# Patient Record
Sex: Female | Born: 1985 | Race: Black or African American | Hispanic: No | Marital: Single | Smoking: Former smoker
Health system: Southern US, Community
[De-identification: ages and names within clinical notes are randomized; demographics above are authoritative.]

## PROBLEM LIST (undated history)

## (undated) DIAGNOSIS — G43909 Migraine, unspecified, not intractable, without status migrainosus: Secondary | ICD-10-CM

## (undated) DIAGNOSIS — D649 Anemia, unspecified: Secondary | ICD-10-CM

## (undated) DIAGNOSIS — I10 Essential (primary) hypertension: Secondary | ICD-10-CM

## (undated) DIAGNOSIS — K219 Gastro-esophageal reflux disease without esophagitis: Secondary | ICD-10-CM

## (undated) DIAGNOSIS — Z8719 Personal history of other diseases of the digestive system: Secondary | ICD-10-CM

## (undated) HISTORY — DX: Gastro-esophageal reflux disease without esophagitis: K21.9

## (undated) HISTORY — DX: Personal history of other diseases of the digestive system: Z87.19

## (undated) HISTORY — DX: Essential (primary) hypertension: I10

## (undated) HISTORY — DX: Anemia, unspecified: D64.9

---

## 2003-06-15 ENCOUNTER — Ambulatory Visit (HOSPITAL_COMMUNITY): Admission: RE | Admit: 2003-06-15 | Discharge: 2003-06-15 | Payer: Self-pay | Admitting: Internal Medicine

## 2003-06-21 ENCOUNTER — Emergency Department (HOSPITAL_COMMUNITY): Admission: EM | Admit: 2003-06-21 | Discharge: 2003-06-21 | Payer: Self-pay | Admitting: Emergency Medicine

## 2004-09-01 HISTORY — PX: WISDOM TOOTH EXTRACTION: SHX21

## 2005-04-11 ENCOUNTER — Ambulatory Visit: Payer: Self-pay | Admitting: Family Medicine

## 2010-06-28 ENCOUNTER — Ambulatory Visit: Payer: Self-pay | Admitting: Internal Medicine

## 2013-12-30 ENCOUNTER — Ambulatory Visit: Payer: Self-pay | Admitting: Family Medicine

## 2014-02-15 ENCOUNTER — Ambulatory Visit: Payer: Self-pay | Admitting: Family Medicine

## 2014-03-01 ENCOUNTER — Ambulatory Visit: Payer: Self-pay | Admitting: Family Medicine

## 2014-04-24 ENCOUNTER — Encounter (INDEPENDENT_AMBULATORY_CARE_PROVIDER_SITE_OTHER): Payer: Self-pay

## 2014-04-24 ENCOUNTER — Ambulatory Visit (INDEPENDENT_AMBULATORY_CARE_PROVIDER_SITE_OTHER): Payer: BC Managed Care – PPO | Admitting: Family Medicine

## 2014-04-24 ENCOUNTER — Encounter: Payer: Self-pay | Admitting: Family Medicine

## 2014-04-24 VITALS — BP 118/76 | HR 75 | Temp 98.4°F | Ht 64.25 in | Wt 171.2 lb

## 2014-04-24 DIAGNOSIS — E559 Vitamin D deficiency, unspecified: Secondary | ICD-10-CM

## 2014-04-24 DIAGNOSIS — Z803 Family history of malignant neoplasm of breast: Secondary | ICD-10-CM | POA: Insufficient documentation

## 2014-04-24 DIAGNOSIS — K219 Gastro-esophageal reflux disease without esophagitis: Secondary | ICD-10-CM | POA: Insufficient documentation

## 2014-04-24 DIAGNOSIS — N644 Mastodynia: Secondary | ICD-10-CM | POA: Insufficient documentation

## 2014-04-24 MED ORDER — OMEPRAZOLE 20 MG PO CPDR: 20.0000 mg | DELAYED_RELEASE_CAPSULE | Freq: Every day | ORAL | Status: DC

## 2014-04-24 NOTE — Progress Notes (Signed)
Subjective:    Patient ID: Ashlee Perry, female    DOB: 1986-06-19, 28 y.o.   MRN: [id number]  HPI Here to est from primary care   Used to see Dr Derrill [facility]   Had an episode of chest wall pain last month - and then soreness to the touch (worse if jumping or running) That is improved  It made her nervous  Family hx of breast cancer - mother - in her 73s  Does not feel any knots - self exams are ok   Does get tender breasts around menses  Not on birth control  Not sexually active  Periods are generally regular -occ early if she is stress  Last visit at health dept - checked for stds and yeast  Everything was negative   Last blood work was done at Dr Dorris Carnes. 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 Breast tenderness 04/24/2014  . Unspecified vitamin D deficiency 04/24/2014  . GERD (gastroesophageal reflux disease) 04/24/2014  . Family history of breast cancer in first degree relative 04/24/2014   Past Medical History  Diagnosis Date  . History of gastroesophageal reflux (GERD)    Past Surgical History  Procedure Laterality Date  . Wisdom tooth extraction  2006   History  Substance Use Topics  . Smoking status: Never Smoker   . Smokeless tobacco: Not on file  . Alcohol Use: Yes   Comment: wine-occ   Family History  Problem Relation Age of Onset  . Arthritis Maternal Grandfather   . Kidney failure Maternal Grandfather   . Ovarian cancer Paternal Grandmother   . Breast cancer Mother   . Prostate cancer Paternal Grandfather   . Hypertension Mother   . Hypertension Father   . Diabetes Maternal Grandmother    No Known Allergies No current outpatient prescriptions on file prior to visit.   No current facility-administered medications on file prior to visit.    Review of Systems Review of Systems  Constitutional: Negative for fever, appetite change, fatigue and unexpected weight change.  Eyes: Negative for pain and visual disturbance.  Respiratory: Negative for cough and shortness of breath.  pos for chest wall tenderness several weeks ago that is resolved now  Cardiovascular: Negative for cp or palpitations    Gastrointestinal: Negative for nausea, diarrhea and constipation. pos for heartburn, neg for dark stool or blood in stool Genitourinary: Negative for urgency and frequency. pos for intermittent breast tenderness  Skin: Negative for pallor or rash   Neurological: Negative for weakness, light-headedness, numbness and headaches.  Hematological: Negative for adenopathy. Does not bruise/bleed easily.  Psychiatric/Behavioral: Negative for dysphoric mood. The patient is not nervous/anxious.  Objective:   Physical Exam  Constitutional: She appears well-developed and well-nourished. No distress.  obese and well appearing   HENT:  Head: Normocephalic and atraumatic.  Right Ear: External ear normal.  Left Ear: External ear normal.  Nose: Nose normal.  Mouth/Throat: Oropharynx is clear and moist.  Scant cerumen  Eyes: Conjunctivae and EOM are normal. Pupils are equal, round, and reactive to light. No scleral icterus.  Neck: Normal range of motion. Neck supple. No JVD present. No thyromegaly present.  Cardiovascular: Normal rate, regular rhythm, normal  heart sounds and intact distal pulses.  Exam reveals no gallop.   Pulmonary/Chest: Effort normal and breath sounds normal. No respiratory distress. She has no wheezes. She has no rales. She exhibits no tenderness.  Abdominal: Soft. Bowel sounds are normal. She exhibits no distension and no mass. There is no tenderness.  Genitourinary:  Breast exam: No mass, nodules, thickening, tenderness, bulging, retraction, inflamation, nipple discharge or skin changes noted.  No axillary or clavicular LA.        Musculoskeletal: She exhibits no edema and no tenderness.  Lymphadenopathy:    She has no cervical adenopathy.  Neurological: She is alert. She has normal reflexes. No cranial nerve deficit. She exhibits normal muscle tone. Coordination normal.  Skin: Skin is warm and dry. No rash noted. No erythema. No pallor.  Psychiatric: She has a normal mood and affect.          Assessment & Plan:   Problem List Items Addressed This Visit     Digestive   GERD (gastroesophageal reflux disease)     Symptom frequency warrants daily PPI tx  Px omeprazole 20 mg for daily use  Will re check at health mt exam Diet info given for gerd as well  Wt loss enc    Relevant Medications      omeprazole (PRILOSEC) capsule     Other   Breast tenderness - Primary     Suspect fibrocystic change  Reassuring exam today In light of fam hx of breast ca will ref for bilat dx mammogram Disc limiting caffeine as well  Enc self breast exams     Relevant Orders      MM Digital Diagnostic Bilat   Unspecified vitamin D deficiency     Sent for labs from winter from prev physician  Is on 2 mvi daily and getting sun exposure also now  No symptoms Disc imp of D for bone and overall health     Family history of breast cancer in first degree relative     Disc risk factor for breast ca Enc self exams  Dx mammo scheduled for breast tenderness    Relevant Orders      MM Digital Diagnostic Bilat

## 2014-04-24 NOTE — Progress Notes (Signed)
Pre visit review using our clinic review tool, if applicable. No additional management support is needed unless otherwise documented below in the visit note. 

## 2014-04-24 NOTE — Assessment & Plan Note (Signed)
Suspect fibrocystic change  Reassuring exam today In light of fam hx of breast ca will ref for bilat dx mammogram Disc limiting caffeine as well  Enc self breast exams

## 2014-04-24 NOTE — Patient Instructions (Addendum)
Stop at check out for mammogram referral  Take omeprazole every day - I sent a px to your pharmacy  Avoid caffeine  Take multi vitamins as directed  Send for last labs from Dr Juluis Pitch please   Follow up in Jan or Feb for annual exam with labs prior

## 2014-04-24 NOTE — Assessment & Plan Note (Signed)
Sent for labs from winter from prev physician  Is on 2 mvi daily and getting sun exposure also now  No symptoms Disc imp of D for bone and overall health

## 2014-04-24 NOTE — Assessment & Plan Note (Signed)
Symptom frequency warrants daily PPI tx  Px omeprazole 20 mg for daily use  Will re check at health mt exam Diet info given for gerd as well  Wt loss enc

## 2014-04-24 NOTE — Assessment & Plan Note (Addendum)
Disc risk factor for breast ca Enc self exams  Dx mammo scheduled for breast tenderness

## 2014-05-05 ENCOUNTER — Other Ambulatory Visit: Payer: Self-pay | Admitting: Family Medicine

## 2014-05-05 DIAGNOSIS — N644 Mastodynia: Secondary | ICD-10-CM

## 2014-05-05 DIAGNOSIS — Z803 Family history of malignant neoplasm of breast: Secondary | ICD-10-CM

## 2014-05-15 ENCOUNTER — Ambulatory Visit
Admission: RE | Admit: 2014-05-15 | Discharge: 2014-05-15 | Disposition: A | Discharge status: Home / Self Care | Payer: BC Managed Care – PPO | Source: Ambulatory Visit | Attending: Family Medicine | Admitting: Family Medicine

## 2014-05-15 DIAGNOSIS — Z803 Family history of malignant neoplasm of breast: Secondary | ICD-10-CM

## 2014-05-15 DIAGNOSIS — N644 Mastodynia: Secondary | ICD-10-CM

## 2014-05-16 ENCOUNTER — Encounter: Payer: Self-pay | Admitting: *Deleted

## 2014-10-01 ENCOUNTER — Telehealth: Payer: Self-pay | Admitting: Family Medicine

## 2014-10-01 DIAGNOSIS — E559 Vitamin D deficiency, unspecified: Secondary | ICD-10-CM

## 2014-10-01 DIAGNOSIS — Z0000 Encounter for general adult medical examination without abnormal findings: Secondary | ICD-10-CM | POA: Insufficient documentation

## 2014-10-01 NOTE — Telephone Encounter (Signed)
-----   Message from Alvina Chouerri J Walsh sent at 09/27/2014  3:08 PM EST ----- Regarding: Lab orders for Monday, 2.1.16 Patient is scheduled for CPX labs, please order future labs, Thanks , Camelia Engerri

## 2014-10-02 ENCOUNTER — Other Ambulatory Visit: Payer: BC Managed Care – PPO

## 2014-10-03 ENCOUNTER — Other Ambulatory Visit (INDEPENDENT_AMBULATORY_CARE_PROVIDER_SITE_OTHER): Payer: BC Managed Care – PPO

## 2014-10-03 DIAGNOSIS — Z0000 Encounter for general adult medical examination without abnormal findings: Secondary | ICD-10-CM

## 2014-10-03 DIAGNOSIS — E559 Vitamin D deficiency, unspecified: Secondary | ICD-10-CM

## 2014-10-03 LAB — CBC WITH DIFFERENTIAL/PLATELET
Basophils Absolute: 0 10*3/uL (ref 0.0–0.1)
Basophils Relative: 0.4 % (ref 0.0–3.0)
Eosinophils Absolute: 0 10*3/uL (ref 0.0–0.7)
Eosinophils Relative: 0.7 % (ref 0.0–5.0)
HEMATOCRIT: 36.2 % (ref 36.0–46.0)
Hemoglobin: 12 g/dL (ref 12.0–15.0)
Lymphocytes Relative: 33.5 % (ref 12.0–46.0)
Lymphs Abs: 2.3 10*3/uL (ref 0.7–4.0)
MCHC: 33.2 g/dL (ref 30.0–36.0)
MCV: 82.9 fl (ref 78.0–100.0)
Monocytes Absolute: 0.4 10*3/uL (ref 0.1–1.0)
Monocytes Relative: 6.3 % (ref 3.0–12.0)
Neutro Abs: 4 10*3/uL (ref 1.4–7.7)
Neutrophils Relative %: 59.1 % (ref 43.0–77.0)
Platelets: 354 10*3/uL (ref 150.0–400.0)
RBC: 4.36 Mil/uL (ref 3.87–5.11)
RDW: 13.2 % (ref 11.5–15.5)
WBC: 6.7 10*3/uL (ref 4.0–10.5)

## 2014-10-03 LAB — COMPREHENSIVE METABOLIC PANEL
ALK PHOS: 50 U/L (ref 39–117)
ALT: 13 U/L (ref 0–35)
AST: 22 U/L (ref 0–37)
Albumin: 4.2 g/dL (ref 3.5–5.2)
BUN: 12 mg/dL (ref 6–23)
CALCIUM: 9.4 mg/dL (ref 8.4–10.5)
CHLORIDE: 103 meq/L (ref 96–112)
CO2: 28 mEq/L (ref 19–32)
Creatinine, Ser: 0.72 mg/dL (ref 0.40–1.20)
GFR: 121.45 mL/min (ref >60.00)
Glucose, Bld: 94 mg/dL (ref 70–99)
Potassium: 4 mEq/L (ref 3.5–5.1)
Sodium: 136 mEq/L (ref 135–145)
Total Bilirubin: 0.7 mg/dL (ref 0.2–1.2)
Total Protein: 7.1 g/dL (ref 6.0–8.3)

## 2014-10-03 LAB — LIPID PANEL
CHOLESTEROL: 170 mg/dL (ref 0–200)
HDL: 56.7 mg/dL (ref >39.00)
LDL CALC: 96 mg/dL (ref 0–99)
NonHDL: 113.3
Total CHOL/HDL Ratio: 3
Triglycerides: 88 mg/dL (ref 0.0–149.0)
VLDL: 17.6 mg/dL (ref 0.0–40.0)

## 2014-10-03 LAB — VITAMIN D 25 HYDROXY (VIT D DEFICIENCY, FRACTURES): VITD: 26.93 ng/mL — AB (ref 30.00–100.00)

## 2014-10-03 LAB — TSH: TSH: 2.04 u[IU]/mL (ref 0.35–4.50)

## 2014-10-09 ENCOUNTER — Encounter: Payer: BC Managed Care – PPO | Admitting: Family Medicine

## 2014-10-18 ENCOUNTER — Other Ambulatory Visit (HOSPITAL_COMMUNITY)
Admission: RE | Admit: 2014-10-18 | Discharge: 2014-10-18 | Disposition: A | Discharge status: Home / Self Care | Payer: BC Managed Care – PPO | Source: Ambulatory Visit | Attending: Family Medicine | Admitting: Family Medicine

## 2014-10-18 ENCOUNTER — Ambulatory Visit (INDEPENDENT_AMBULATORY_CARE_PROVIDER_SITE_OTHER): Payer: BC Managed Care – PPO | Admitting: Family Medicine

## 2014-10-18 ENCOUNTER — Encounter: Payer: Self-pay | Admitting: Family Medicine

## 2014-10-18 VITALS — BP 116/84 | HR 62 | Temp 98.3°F | Ht 64.0 in | Wt 174.8 lb

## 2014-10-18 DIAGNOSIS — N760 Acute vaginitis: Secondary | ICD-10-CM | POA: Insufficient documentation

## 2014-10-18 DIAGNOSIS — Z1151 Encounter for screening for human papillomavirus (HPV): Secondary | ICD-10-CM | POA: Insufficient documentation

## 2014-10-18 DIAGNOSIS — Z803 Family history of malignant neoplasm of breast: Secondary | ICD-10-CM

## 2014-10-18 DIAGNOSIS — E559 Vitamin D deficiency, unspecified: Secondary | ICD-10-CM

## 2014-10-18 DIAGNOSIS — Z01419 Encounter for gynecological examination (general) (routine) without abnormal findings: Secondary | ICD-10-CM | POA: Insufficient documentation

## 2014-10-18 DIAGNOSIS — Z0000 Encounter for general adult medical examination without abnormal findings: Secondary | ICD-10-CM

## 2014-10-18 DIAGNOSIS — Z23 Encounter for immunization: Secondary | ICD-10-CM

## 2014-10-18 DIAGNOSIS — Z113 Encounter for screening for infections with a predominantly sexual mode of transmission: Secondary | ICD-10-CM | POA: Diagnosis present

## 2014-10-18 NOTE — Progress Notes (Signed)
Pre visit review using our clinic review tool, if applicable. No additional management support is needed unless otherwise documented below in the visit note. 

## 2014-10-18 NOTE — Patient Instructions (Signed)
Pap test done today  STD screen today Also tests for vaginitis (yeast or bacterial)  I will contact you with results and treatment  Take an extra vitamin D 2000 iu daily over the counter

## 2014-10-18 NOTE — Progress Notes (Signed)
Subjective:    Patient ID: Ashlee Perry, female    DOB: 02/26/1986, 29 y.o.   MRN: [id number]  HPI Here for health maintenance exam and to review chronic medical problems    Thinks she may have a yeast infection - itching and a little burning  Irritated  She gets them frequently  She used monistat and some other brands   Thinks her last pap was at the health dept  Over a year ago  No abnormal results Has had bacterial vaginosis in the past  Does want to be screened for STD Also interested in HIV test    She does eat greek yogurt   Periods - normal / regular  Last about 4-5 days  occ cramps  Not on OC and does need birth control   Omeprazole is helping GERD  Mammogram 9/15 - will get yearly because of her mother's pre menopausal breast cancer  No lumps or changes on self exam  Has dense breasts    Needs Tdap today Declines flu shots   Results for orders placed or performed in visit on 10/03/14  CBC with Differential/Platelet  Result Value Ref Range   WBC 6.7 4.0 - 10.5 K/uL   RBC 4.36 3.87 - 5.11 Mil/uL   Hemoglobin 12.0 12.0 - 15.0 g/dL   HCT 40.936.2 81.136.0 - 91.446.0 %   MCV 82.9 78.0 - 100.0 fl   MCHC 33.2 30.0 - 36.0 g/dL   RDW 78.213.2 95.611.5 - 21.315.5 %   Platelets 354.0 150.0 - 400.0 K/uL   Neutrophils Relative % 59.1 43.0 - 77.0 %   Lymphocytes Relative 33.5 12.0 - 46.0 %   Monocytes Relative 6.3 3.0 - 12.0 %   Eosinophils Relative 0.7 0.0 - 5.0 %   Basophils Relative 0.4 0.0 - 3.0 %   Neutro Abs 4.0 1.4 - 7.7 K/uL   Lymphs Abs 2.3 0.7 - 4.0 K/uL   Monocytes Absolute 0.4 0.1 - 1.0 K/uL   Eosinophils Absolute 0.0 0.0 - 0.7 K/uL   Basophils Absolute 0.0 0.0 - 0.1 K/uL  Comprehensive metabolic panel  Result Value Ref Range   Sodium 136 135 - 145 mEq/L   Potassium 4.0 3.5 - 5.1 mEq/L   Chloride 103 96 - 112 mEq/L   CO2 28 19 - 32 mEq/L   Glucose, Bld 94 70 - 99 mg/dL   BUN 12 6 - 23 mg/dL   Creatinine, Ser 0.860.72 0.40 - 1.20 mg/dL   Total Bilirubin 0.7 0.2 - 1.2  mg/dL   Alkaline Phosphatase 50 39 - 117 U/L   AST 22 0 - 37 U/L   ALT 13 0 - 35 U/L   Total Protein 7.1 6.0 - 8.3 g/dL   Albumin 4.2 3.5 - 5.2 g/dL   Calcium 9.4 8.4 - 57.810.5 mg/dL   GFR 469.62121.45 >95.28>60.00 mL/min  Lipid panel  Result Value Ref Range   Cholesterol 170 0 - 200 mg/dL   Triglycerides 41.388.0 0.0 - 149.0 mg/dL   HDL 24.4056.70 >10.27>39.00 mg/dL   VLDL 25.317.6 0.0 - 66.440.0 mg/dL   LDL Cholesterol 96 0 - 99 mg/dL   Total CHOL/HDL Ratio 3    NonHDL 113.30   TSH  Result Value Ref Range   TSH 2.04 0.35 - 4.50 uIU/mL  Vit D  25 hydroxy (rtn osteoporosis monitoring)  Result Value Ref Range   VITD 26.93 (L) 30.00 - 100.00 ng/mL    Needs more vit D    Patient Active Problem List  Diagnosis Date Noted  . Encounter for routine gynecological examination 10/18/2014  . Screen for STD (sexually transmitted disease) 10/18/2014  . Routine general medical examination at a health care facility 10/01/2014  . Breast tenderness 04/24/2014  . Vitamin D deficiency 04/24/2014  . GERD (gastroesophageal reflux disease) 04/24/2014  . Family history of breast cancer in first degree relative 04/24/2014   Past Medical History  Diagnosis Date  . History of gastroesophageal reflux (GERD)    Past Surgical History  Procedure Laterality Date  . Wisdom tooth extraction  2006   History  Substance Use Topics  . Smoking status: Never Smoker   . Smokeless tobacco: Not on file  . Alcohol Use: Yes     Comment: wine-occ   Family History  Problem Relation Age of Onset  . Arthritis Maternal Grandfather   . Kidney failure Maternal Grandfather   . Ovarian cancer Paternal Grandmother   . Breast cancer Mother   . Prostate cancer Paternal Grandfather   . Hypertension Mother   . Hypertension Father   . Diabetes Maternal Grandmother    No Known Allergies Current Outpatient Prescriptions on File Prior to Visit  Medication Sig Dispense Refill  . Multiple Vitamins-Minerals (ALIVE WOMENS GUMMY PO) Take 1 tablet by  mouth daily.    Marland Kitchen omeprazole (PRILOSEC) 20 MG capsule Take 1 capsule (20 mg total) by mouth daily. 30 capsule 11   No current facility-administered medications on file prior to visit.    Review of Systems Review of Systems  Constitutional: Negative for fever, appetite change, fatigue and unexpected weight change.  Eyes: Negative for pain and visual disturbance.  Respiratory: Negative for cough and shortness of breath.   Cardiovascular: Negative for cp or palpitations    Gastrointestinal: Negative for nausea, diarrhea and constipation.  Genitourinary: Negative for urgency and frequency. pos for vaginal irritation  Skin: Negative for pallor or rash   Neurological: Negative for weakness, light-headedness, numbness and headaches.  Hematological: Negative for adenopathy. Does not bruise/bleed easily.  Psychiatric/Behavioral: Negative for dysphoric mood. The patient is not nervous/anxious.         Objective:   Physical Exam  Constitutional: She appears well-developed and well-nourished. No distress.  obese and well appearing   HENT:  Head: Normocephalic and atraumatic.  Right Ear: External ear normal.  Left Ear: External ear normal.  Mouth/Throat: Oropharynx is clear and moist.  Eyes: Conjunctivae and EOM are normal. Pupils are equal, round, and reactive to light. No scleral icterus.  Neck: Normal range of motion. Neck supple. No JVD present. Carotid bruit is not present. No thyromegaly present.  Cardiovascular: Normal rate, regular rhythm, normal heart sounds and intact distal pulses.  Exam reveals no gallop.   Pulmonary/Chest: Effort normal and breath sounds normal. No respiratory distress. She has no wheezes. She exhibits no tenderness.  Abdominal: Soft. Bowel sounds are normal. She exhibits no distension, no abdominal bruit and no mass. There is no tenderness.  Genitourinary: Uterus normal. No breast swelling, tenderness, discharge or bleeding. There is no rash, tenderness or lesion  on the right labia. There is no rash, tenderness or lesion on the left labia. Uterus is not enlarged and not tender. Cervix exhibits discharge. Right adnexum displays no mass, no tenderness and no fullness. Left adnexum displays no mass, no tenderness and no fullness. No bleeding in the vagina. Vaginal discharge found.  Breast exam: No mass, nodules, thickening, tenderness, bulging, retraction, inflamation, nipple discharge or skin changes noted.  No axillary or clavicular LA.  Very dense breast tissue   Scant pale vaginal d/c without odor   Musculoskeletal: Normal range of motion. She exhibits no edema or tenderness.  Lymphadenopathy:    She has no cervical adenopathy.  Neurological: She is alert. She has normal reflexes. No cranial nerve deficit. She exhibits normal muscle tone. Coordination normal.  Skin: Skin is warm and dry. No rash noted. No erythema. No pallor.  Psychiatric: She has a normal mood and affect.          Assessment & Plan:   Problem List Items Addressed This Visit      Other   Encounter for routine gynecological examination    Pap and exam done  Pt c/o some vaginal irritation so added yeast/gardenarella/trich tests to her pap test as well as gc/chlam for STD screen  Pt states she does not need contraception       Relevant Orders   Cytology - PAP   Family history of breast cancer in first degree relative    Rev last mammogram report-nl  Enc self exams Radiology recommends annual mammograms at this age since her mother had breast cancer pre menopausal She understands and will comply        Routine general medical examination at a health care facility - Primary    Reviewed health habits including diet and exercise and skin cancer prevention Reviewed appropriate screening tests for age  Also reviewed health mt list, fam hx and immunization status , as well as social and family history   Labs reviewed  STD screen and gyn exam today      Screen for STD  (sexually transmitted disease)    Gc/chlam tests send with pap  Also HIV and RPR today  Disc safe sexual practices          Relevant Orders   Cytology - PAP   HIV antibody (with reflex) (Completed)   RPR (Completed)   Vitamin D deficiency    D level is low  Adv to start an additional 2000 iu vit D daily over the counter  Disc imp to bone and overall health        Other Visit Diagnoses    Need for diphtheria-tetanus-pertussis (Tdap) vaccine, adult/adolescent        Relevant Orders    Tdap vaccine greater than or equal to 7yo IM (Completed)

## 2014-10-19 LAB — HIV ANTIBODY (ROUTINE TESTING W REFLEX): HIV: NONREACTIVE

## 2014-10-19 LAB — RPR

## 2014-10-19 NOTE — Assessment & Plan Note (Addendum)
D level is low  Adv to start an additional 2000 iu vit D daily over the counter  Disc imp to bone and overall health

## 2014-10-19 NOTE — Assessment & Plan Note (Signed)
Reviewed health habits including diet and exercise and skin cancer prevention Reviewed appropriate screening tests for age  Also reviewed health mt list, fam hx and immunization status , as well as social and family history   Labs reviewed  STD screen and gyn exam today

## 2014-10-19 NOTE — Assessment & Plan Note (Signed)
Gc/chlam tests send with pap  Also HIV and RPR today  Disc safe sexual practices

## 2014-10-19 NOTE — Assessment & Plan Note (Signed)
Pap and exam done  Pt c/o some vaginal irritation so added yeast/gardenarella/trich tests to her pap test as well as gc/chlam for STD screen  Pt states she does not need contraception

## 2014-10-19 NOTE — Assessment & Plan Note (Signed)
Rev last mammogram report-nl  Enc self exams Radiology recommends annual mammograms at this age since her mother had breast cancer pre menopausal She understands and will comply

## 2014-10-20 LAB — CYTOLOGY - PAP

## 2014-10-21 LAB — CERVICOVAGINAL ANCILLARY ONLY
Bacterial vaginitis: NEGATIVE
CANDIDA VAGINITIS: NEGATIVE

## 2015-03-07 ENCOUNTER — Ambulatory Visit: Payer: BC Managed Care – PPO | Admitting: Family Medicine

## 2015-04-16 ENCOUNTER — Other Ambulatory Visit: Payer: Self-pay

## 2015-04-16 DIAGNOSIS — Z1231 Encounter for screening mammogram for malignant neoplasm of breast: Secondary | ICD-10-CM

## 2015-05-17 ENCOUNTER — Other Ambulatory Visit: Payer: Self-pay | Admitting: Family Medicine

## 2015-05-28 ENCOUNTER — Ambulatory Visit: Payer: BC Managed Care – PPO

## 2015-06-25 ENCOUNTER — Ambulatory Visit
Admission: RE | Admit: 2015-06-25 | Discharge: 2015-06-25 | Disposition: A | Discharge status: Home / Self Care | Payer: BC Managed Care – PPO | Source: Ambulatory Visit

## 2015-06-25 ENCOUNTER — Ambulatory Visit: Payer: BC Managed Care – PPO

## 2015-06-25 DIAGNOSIS — Z1231 Encounter for screening mammogram for malignant neoplasm of breast: Secondary | ICD-10-CM

## 2015-06-25 LAB — HM MAMMOGRAPHY: HM Mammogram: NORMAL

## 2015-06-27 ENCOUNTER — Encounter: Payer: Self-pay | Admitting: Family Medicine

## 2015-06-27 ENCOUNTER — Encounter: Payer: Self-pay | Admitting: *Deleted

## 2015-07-16 ENCOUNTER — Other Ambulatory Visit: Payer: Self-pay | Admitting: Family Medicine

## 2015-07-17 MED ORDER — OMEPRAZOLE 20 MG PO TBEC: 1.0000 | DELAYED_RELEASE_TABLET | Freq: Two times a day (BID) | ORAL | Status: DC

## 2015-07-17 NOTE — Telephone Encounter (Signed)
Please inc to bid and refil for a year

## 2015-07-17 NOTE — Telephone Encounter (Signed)
Done and pt notified. 

## 2015-07-17 NOTE — Telephone Encounter (Signed)
[facility] at [facility] will d/c initial rx for once daily dosing and I will send in corrected rx with bid dosing. Morrie Sheldonshley voiced understanding.

## 2015-07-17 NOTE — Telephone Encounter (Signed)
Pt left v/m requesting status of omeprazole refill; pt request cb.

## 2015-07-17 NOTE — Addendum Note (Signed)
Addended by: Patience MuscaISLEY, Espiridion Supinski M on: 07/17/2015 03:51 PM   Modules accepted: Orders

## 2015-07-17 NOTE — Telephone Encounter (Signed)
See note from pharmacy in Rx request, pt is requesting to increase Rx to BID, please advise

## 2015-10-08 ENCOUNTER — Other Ambulatory Visit: Payer: Self-pay | Admitting: Family Medicine

## 2015-10-09 MED ORDER — OMEPRAZOLE 20 MG PO CPDR: 20.0000 mg | DELAYED_RELEASE_CAPSULE | Freq: Two times a day (BID) | ORAL | Status: DC

## 2015-10-09 NOTE — Telephone Encounter (Signed)
[facility] pharmacy left v/m; the request was sent for omeprazole 20 mg capsules (not tablets); pts previous plan covered the tablets and the new ins requires omeprazole 20 mg capsules. Please advise.

## 2015-10-09 NOTE — Telephone Encounter (Signed)
Rx for Caps not tabs sent to pt's pharmacy

## 2015-10-09 NOTE — Addendum Note (Signed)
Addended by: Shon Millet on: 10/09/2015 04:56 PM   Modules accepted: Orders

## 2015-10-23 ENCOUNTER — Ambulatory Visit (INDEPENDENT_AMBULATORY_CARE_PROVIDER_SITE_OTHER): Payer: BC Managed Care – PPO | Admitting: Family Medicine

## 2015-10-23 ENCOUNTER — Encounter: Payer: Self-pay | Admitting: Family Medicine

## 2015-10-23 ENCOUNTER — Other Ambulatory Visit (HOSPITAL_COMMUNITY)
Admission: RE | Admit: 2015-10-23 | Discharge: 2015-10-23 | Disposition: A | Discharge status: Home / Self Care | Payer: BC Managed Care – PPO | Source: Ambulatory Visit | Attending: Family Medicine | Admitting: Family Medicine

## 2015-10-23 VITALS — BP 116/88 | HR 74 | Temp 98.4°F | Ht 65.0 in | Wt 179.0 lb

## 2015-10-23 DIAGNOSIS — E559 Vitamin D deficiency, unspecified: Secondary | ICD-10-CM

## 2015-10-23 DIAGNOSIS — Z113 Encounter for screening for infections with a predominantly sexual mode of transmission: Secondary | ICD-10-CM | POA: Insufficient documentation

## 2015-10-23 DIAGNOSIS — Z0000 Encounter for general adult medical examination without abnormal findings: Secondary | ICD-10-CM | POA: Diagnosis not present

## 2015-10-23 DIAGNOSIS — Z01419 Encounter for gynecological examination (general) (routine) without abnormal findings: Secondary | ICD-10-CM | POA: Diagnosis not present

## 2015-10-23 LAB — COMPREHENSIVE METABOLIC PANEL
ALT: 12 U/L (ref 0–35)
AST: 22 U/L (ref 0–37)
Albumin: 4.5 g/dL (ref 3.5–5.2)
Alkaline Phosphatase: 54 U/L (ref 39–117)
BUN: 14 mg/dL (ref 6–23)
CO2: 29 mEq/L (ref 19–32)
Calcium: 10 mg/dL (ref 8.4–10.5)
Chloride: 103 mEq/L (ref 96–112)
Creatinine, Ser: 0.77 mg/dL (ref 0.40–1.20)
GFR: 111.64 mL/min (ref >60.00)
Glucose, Bld: 96 mg/dL (ref 70–99)
POTASSIUM: 4 meq/L (ref 3.5–5.1)
SODIUM: 136 meq/L (ref 135–145)
TOTAL PROTEIN: 8 g/dL (ref 6.0–8.3)
Total Bilirubin: 0.7 mg/dL (ref 0.2–1.2)

## 2015-10-23 LAB — LIPID PANEL
CHOL/HDL RATIO: 3
Cholesterol: 168 mg/dL (ref 0–200)
HDL: 55.5 mg/dL (ref >39.00)
LDL CALC: 96 mg/dL (ref 0–99)
NonHDL: 112.61
TRIGLYCERIDES: 82 mg/dL (ref 0.0–149.0)
VLDL: 16.4 mg/dL (ref 0.0–40.0)

## 2015-10-23 LAB — CBC WITH DIFFERENTIAL/PLATELET
BASOS ABS: 0 10*3/uL (ref 0.0–0.1)
Basophils Relative: 0.5 % (ref 0.0–3.0)
EOS ABS: 0.1 10*3/uL (ref 0.0–0.7)
Eosinophils Relative: 0.9 % (ref 0.0–5.0)
HCT: 37.5 % (ref 36.0–46.0)
HEMOGLOBIN: 12.3 g/dL (ref 12.0–15.0)
Lymphocytes Relative: 27.3 % (ref 12.0–46.0)
Lymphs Abs: 2.5 10*3/uL (ref 0.7–4.0)
MCHC: 32.8 g/dL (ref 30.0–36.0)
MCV: 82.1 fl (ref 78.0–100.0)
Monocytes Absolute: 0.6 10*3/uL (ref 0.1–1.0)
Monocytes Relative: 6.2 % (ref 3.0–12.0)
NEUTROS PCT: 65.1 % (ref 43.0–77.0)
Neutro Abs: 6 10*3/uL (ref 1.4–7.7)
Platelets: 360 10*3/uL (ref 150.0–400.0)
RBC: 4.57 Mil/uL (ref 3.87–5.11)
RDW: 13.9 % (ref 11.5–15.5)
WBC: 9.3 10*3/uL (ref 4.0–10.5)

## 2015-10-23 LAB — VITAMIN D 25 HYDROXY (VIT D DEFICIENCY, FRACTURES): VITD: 23.54 ng/mL — ABNORMAL LOW (ref 30.00–100.00)

## 2015-10-23 LAB — TSH: TSH: 1.96 u[IU]/mL (ref 0.35–4.50)

## 2015-10-23 NOTE — Assessment & Plan Note (Signed)
gc and chlam screen with pap HIV and RPR with labs  Pt does not suspect exposure  No symptoms

## 2015-10-23 NOTE — Progress Notes (Signed)
Subjective:    Patient ID: Ashlee Perry, female    DOB: 03-07-1986, 30 y.o.   MRN: [id number]  HPI Here for health maintenance exam and to review chronic medical problems    Has been feeling ok   Wt is up 5 lb with bmi of 29  Just got over uri   Flu shots- declines   Mm 10/16 - nl  No lumps or changes on self exam  Pap 02/16 - neg  Period-heavy and occ painful Everlene Farrier her sweat  Bad cramps sometimes -not always  Does not want to go OC Not sexually active  Is ok with std screening   Td 2/16   Vitamin D -low last year in 27s   Due for labs today   Patient Active Problem List   Diagnosis Date Noted  . Encounter for routine gynecological examination 10/18/2014  . Screen for STD (sexually transmitted disease) 10/18/2014  . Routine general medical examination at a health care facility 10/01/2014  . Breast tenderness 04/24/2014  . Vitamin D deficiency 04/24/2014  . GERD (gastroesophageal reflux disease) 04/24/2014  . Family history of breast cancer in first degree relative 04/24/2014   Past Medical History  Diagnosis Date  . History of gastroesophageal reflux (GERD)    Past Surgical History  Procedure Laterality Date  . Wisdom tooth extraction  2006   Social History  Substance Use Topics  . Smoking status: Never Smoker   . Smokeless tobacco: None  . Alcohol Use: Yes     Comment: wine-occ   Family History  Problem Relation Age of Onset  . Arthritis Maternal Grandfather   . Kidney failure Maternal Grandfather   . Ovarian cancer Paternal Grandmother   . Breast cancer Mother   . Prostate cancer Paternal Grandfather   . Hypertension Mother   . Hypertension Father   . Diabetes Maternal Grandmother    No Known Allergies Current Outpatient Prescriptions on File Prior to Visit  Medication Sig Dispense Refill  . Multiple Vitamins-Minerals (ALIVE WOMENS GUMMY PO) Take 1 tablet by mouth daily.    Marland Kitchen omeprazole (PRILOSEC) 20 MG capsule Take 1 capsule (20 mg  total) by mouth 2 (two) times daily. 30 capsule 10   No current facility-administered medications on file prior to visit.     Review of Systems    Review of Systems  Constitutional: Negative for fever, appetite change, fatigue and unexpected weight change.  Eyes: Negative for pain and visual disturbance.  Respiratory: Negative for cough and shortness of breath.   Cardiovascular: Negative for cp or palpitations    Gastrointestinal: Negative for nausea, diarrhea and constipation.  Genitourinary: Negative for urgency and frequency. pos for menstrual cramps  Skin: Negative for pallor or rash   Neurological: Negative for weakness, light-headedness, numbness and headaches.  Hematological: Negative for adenopathy. Does not bruise/bleed easily.  Psychiatric/Behavioral: Negative for dysphoric mood. The patient is not nervous/anxious.      Objective:   Physical Exam  Constitutional: She appears well-developed and well-nourished. No distress.  overwt and well app  HENT:  Head: Normocephalic and atraumatic.  Right Ear: External ear normal.  Left Ear: External ear normal.  Mouth/Throat: Oropharynx is clear and moist.  Eyes: Conjunctivae and EOM are normal. Pupils are equal, round, and reactive to light. No scleral icterus.  Neck: Normal range of motion. Neck supple. No JVD present. Carotid bruit is not present. No thyromegaly present.  Cardiovascular: Normal rate, regular rhythm, normal heart sounds and intact distal pulses.  Exam reveals no gallop.   Pulmonary/Chest: Effort normal and breath sounds normal. No respiratory distress. She has no wheezes. She exhibits no tenderness.  Abdominal: Soft. Bowel sounds are normal. She exhibits no distension, no abdominal bruit and no mass. There is no tenderness.  Genitourinary: Vagina normal and uterus normal. No breast swelling, tenderness, discharge or bleeding. There is no rash, tenderness or lesion on the right labia. There is no rash, tenderness or  lesion on the left labia. Uterus is not enlarged and not tender. Cervix exhibits no motion tenderness, no discharge and no friability. Right adnexum displays no mass, no tenderness and no fullness. Left adnexum displays no mass, no tenderness and no fullness. No erythema or bleeding in the vagina. No vaginal discharge found.  Breast exam: No mass, nodules, thickening, tenderness, bulging, retraction, inflamation, nipple discharge or skin changes noted.  No axillary or clavicular LA.     Small introitus     Musculoskeletal: Normal range of motion. She exhibits no edema or tenderness.  Lymphadenopathy:    She has no cervical adenopathy.  Neurological: She is alert. She has normal reflexes. No cranial nerve deficit. She exhibits normal muscle tone. Coordination normal.  Skin: Skin is warm and dry. No rash noted. No erythema. No pallor.  Tattoos noted   Psychiatric: She has a normal mood and affect.          Assessment & Plan:   Problem List Items Addressed This Visit      Other   Encounter for routine gynecological examination    Exam/pap done with std screen No c/o except occ painful menses She does not want hormonal tx at this time Does not need contraception      Relevant Orders   Cytology - PAP   Routine general medical examination at a health care facility - Primary    Reviewed health habits including diet and exercise and skin cancer prevention Reviewed appropriate screening tests for age  Also reviewed health mt list, fam hx and immunization status , as well as social and family history   See HPI Labs today Get vitamin D over the counter 2000 iu pills and take one daily  Also continue multivitamin Pap and std screen done today  Labs today  Eat a healthy diet  Exercise when you can  Take care of yourself        Relevant Orders   CBC with Differential/Platelet (Completed)   TSH (Completed)   Comprehensive metabolic panel (Completed)   Lipid panel (Completed)    Screen for STD (sexually transmitted disease)    gc and chlam screen with pap HIV and RPR with labs  Pt does not suspect exposure  No symptoms       Relevant Orders   HIV antibody (with reflex)   RPR   Cytology - PAP   Vitamin D deficiency    Level today Disc imp to bone and overall health Enc her to add another 2000 iu daily in addition to her MVI        Relevant Orders   VITAMIN D 25 Hydroxy (Vit-D Deficiency, Fractures) (Completed)

## 2015-10-23 NOTE — Patient Instructions (Signed)
Get vitamin D over the counter 2000 iu pills and take one daily  Also continue multivitamin Pap and std screen done today  Labs today  Eat a healthy diet  Exercise when you can  Take care of yourself

## 2015-10-23 NOTE — Assessment & Plan Note (Signed)
Level today Disc imp to bone and overall health Enc her to add another 2000 iu daily in addition to her MVI

## 2015-10-23 NOTE — Progress Notes (Signed)
Pre visit review using our clinic review tool, if applicable. No additional management support is needed unless otherwise documented below in the visit note. 

## 2015-10-23 NOTE — Assessment & Plan Note (Signed)
Reviewed health habits including diet and exercise and skin cancer prevention Reviewed appropriate screening tests for age  Also reviewed health mt list, fam hx and immunization status , as well as social and family history   See HPI Labs today Get vitamin D over the counter 2000 iu pills and take one daily  Also continue multivitamin Pap and std screen done today  Labs today  Eat a healthy diet  Exercise when you can  Take care of yourself

## 2015-10-23 NOTE — Assessment & Plan Note (Signed)
Exam/pap done with std screen No c/o except occ painful menses She does not want hormonal tx at this time Does not need contraception

## 2015-10-24 LAB — HIV ANTIBODY (ROUTINE TESTING W REFLEX): HIV: NONREACTIVE

## 2015-10-24 LAB — RPR

## 2015-10-25 LAB — CYTOLOGY - PAP

## 2016-06-11 ENCOUNTER — Other Ambulatory Visit: Payer: Self-pay | Admitting: Family Medicine

## 2016-06-11 DIAGNOSIS — Z1231 Encounter for screening mammogram for malignant neoplasm of breast: Secondary | ICD-10-CM

## 2016-06-26 ENCOUNTER — Other Ambulatory Visit: Payer: Self-pay | Admitting: Family Medicine

## 2016-06-26 ENCOUNTER — Ambulatory Visit
Admission: RE | Admit: 2016-06-26 | Discharge: 2016-06-26 | Disposition: A | Discharge status: Home / Self Care | Payer: BC Managed Care – PPO | Source: Ambulatory Visit | Attending: Family Medicine | Admitting: Family Medicine

## 2016-06-26 DIAGNOSIS — Z1231 Encounter for screening mammogram for malignant neoplasm of breast: Secondary | ICD-10-CM

## 2016-09-22 ENCOUNTER — Other Ambulatory Visit: Payer: Self-pay | Admitting: Family Medicine

## 2016-09-22 NOTE — Telephone Encounter (Signed)
No recent/future appts., please advise  

## 2016-09-22 NOTE — Telephone Encounter (Signed)
Please schedule a spring f/u and refill until then Thanks  

## 2016-09-22 NOTE — Telephone Encounter (Signed)
Per DPR left voicemail requesting pt to call the office back and schedule a f/u appt

## 2016-09-23 NOTE — Telephone Encounter (Signed)
Pt made cpx appointment 3/12

## 2016-09-23 NOTE — Telephone Encounter (Signed)
Med filled.  

## 2016-11-10 ENCOUNTER — Encounter: Payer: BC Managed Care – PPO | Admitting: Family Medicine

## 2016-12-12 ENCOUNTER — Encounter: Payer: BC Managed Care – PPO | Admitting: Family Medicine

## 2016-12-19 ENCOUNTER — Other Ambulatory Visit: Payer: Self-pay | Admitting: Family Medicine

## 2016-12-31 ENCOUNTER — Ambulatory Visit (INDEPENDENT_AMBULATORY_CARE_PROVIDER_SITE_OTHER): Payer: BC Managed Care – PPO | Admitting: Family Medicine

## 2016-12-31 ENCOUNTER — Other Ambulatory Visit (HOSPITAL_COMMUNITY)
Admission: RE | Admit: 2016-12-31 | Discharge: 2016-12-31 | Disposition: A | Discharge status: Home / Self Care | Payer: BC Managed Care – PPO | Source: Ambulatory Visit | Attending: Family Medicine | Admitting: Family Medicine

## 2016-12-31 ENCOUNTER — Encounter: Payer: Self-pay | Admitting: Family Medicine

## 2016-12-31 VITALS — BP 122/80 | HR 85 | Temp 97.8°F | Ht 64.5 in | Wt 185.0 lb

## 2016-12-31 DIAGNOSIS — Z0000 Encounter for general adult medical examination without abnormal findings: Secondary | ICD-10-CM | POA: Diagnosis not present

## 2016-12-31 DIAGNOSIS — Z803 Family history of malignant neoplasm of breast: Secondary | ICD-10-CM

## 2016-12-31 DIAGNOSIS — E669 Obesity, unspecified: Secondary | ICD-10-CM | POA: Diagnosis not present

## 2016-12-31 DIAGNOSIS — Z01419 Encounter for gynecological examination (general) (routine) without abnormal findings: Secondary | ICD-10-CM

## 2016-12-31 DIAGNOSIS — E559 Vitamin D deficiency, unspecified: Secondary | ICD-10-CM

## 2016-12-31 DIAGNOSIS — Z1231 Encounter for screening mammogram for malignant neoplasm of breast: Secondary | ICD-10-CM | POA: Diagnosis not present

## 2016-12-31 NOTE — Progress Notes (Signed)
Subjective:    Patient ID: Ashlee Perry, female    DOB: [birth year], 31 y.o.   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 Obesity (BMI 30-39.9) 12/31/2016  . Breast cancer screening by mammogram 12/31/2016  . Encounter for routine gynecological examination 10/18/2014  . Screen for STD (sexually transmitted disease) 10/18/2014  . Routine general medical examination at a health care facility 10/01/2014  . Breast tenderness 04/24/2014  . Vitamin D deficiency 04/24/2014  . GERD (gastroesophageal reflux disease) 04/24/2014  . Family history of breast cancer in first degree relative 04/24/2014   Past Medical History:  Diagnosis Date  . History of gastroesophageal reflux (GERD)    Past Surgical History:  Procedure Laterality Date  . WISDOM TOOTH EXTRACTION  2006   Social History  Substance Use Topics  . Smoking status: Never Smoker  . Smokeless tobacco: Never Used  . Alcohol use Yes   Comment: wine-occ   Family History  Problem Relation Age of Onset  . Arthritis Maternal Grandfather   . Kidney failure Maternal Grandfather   . Ovarian cancer Paternal Grandmother   . Breast cancer Mother   . Prostate cancer Paternal Grandfather   . Hypertension Mother   . Hypertension Father   . Diabetes Maternal Grandmother    No Known Allergies Current Outpatient Prescriptions on File Prior to Visit  Medication Sig Dispense Refill  . Multiple Vitamins-Minerals (ALIVE WOMENS GUMMY PO) Take 1 tablet by mouth daily.    Marland Kitchen omeprazole (PRILOSEC) 20 MG capsule TAKE 1 CAPSULE BY MOUTH TWICE DAILY 60 capsule 1   No current facility-administered medications on file prior to visit.     Review of Systems Review of Systems  Constitutional: Negative for fever, appetite change, fatigue and unexpected weight change.  Eyes: Negative for pain and visual disturbance.  Respiratory: Negative for cough and shortness of breath.   Cardiovascular: Negative for cp or palpitations    Gastrointestinal: Negative for nausea, diarrhea and constipation.  Genitourinary: Negative for urgency and frequency.  Skin: Negative for pallor or rash   Neurological: Negative for weakness, light-headedness, numbness and headaches.  Hematological: Negative for adenopathy. Does not bruise/bleed easily.  Psychiatric/Behavioral: Negative for dysphoric mood. The patient is not nervous/anxious.  Objective:   Physical Exam  Constitutional: She appears well-developed and well-nourished. No distress.  obese and well appearing   HENT:  Head: Normocephalic and atraumatic.  Right Ear: External ear normal.  Left Ear: External ear normal.  Mouth/Throat: Oropharynx is clear and moist.  Eyes: Conjunctivae and EOM are normal. Pupils are equal, round, and reactive to light. No scleral icterus.  Neck: Normal range of motion. Neck supple. No JVD present. Carotid bruit is not present. No thyromegaly present.  Cardiovascular:  Normal rate, regular rhythm, normal heart sounds and intact distal pulses.  Exam reveals no gallop.   Pulmonary/Chest: Effort normal and breath sounds normal. No respiratory distress. She has no wheezes. She exhibits no tenderness.  Abdominal: Soft. Bowel sounds are normal. She exhibits no distension, no abdominal bruit and no mass. There is no tenderness.  Genitourinary: No breast swelling, tenderness, discharge or bleeding.  Genitourinary Comments: Breast exam: No mass, nodules, thickening, tenderness, bulging, retraction, inflamation, nipple discharge or skin changes noted.  No axillary or clavicular LA.             Anus appears normal w/o hemorrhoids or masses     External genitalia : nl appearance and hair distribution/no lesions     Urethral meatus : nl size, no lesions or prolapse     Urethra: no masses, tenderness or scarring    Bladder : no masses or tenderness     Vagina: nl general appearance, no discharge or  Lesions, no significant cystocele  or rectocele (tight introitus noted-pt is also nervous about exam)    Cervix: no lesions/ discharge or friability    Uterus: nl size, contour, position, and mobility (not fixed) , non tender    Adnexa : no masses, tenderness, enlargement or nodularity        Musculoskeletal: Normal range of motion. She exhibits no edema or tenderness.  Lymphadenopathy:    She has no cervical adenopathy.  Neurological: She is alert. She has normal reflexes. No cranial nerve deficit. She exhibits normal muscle tone. Coordination normal.  Skin: Skin is warm and dry. No rash noted. No erythema. No pallor.  Some lentigines and skin tags on neck  Psychiatric: She has a normal mood and affect.          Assessment & Plan:   Problem List Items Addressed This Visit      Other   Breast cancer screening by mammogram    Scheduled annual screening mammogram Nl breast exam today  Encouraged monthly self exams   Early due to fam hx of  breast cancer in mother at 48 yo      Relevant Orders   MM DIGITAL SCREENING BILATERAL   Encounter for routine gynecological examination    Routine gyn exam with pap  Pt declines std screen-not sexually active       Relevant Orders   Cytology - PAP   Family history of breast cancer in first degree relative    Mother was diagnosed with breast cancer at 73 She desires yearly mammogram Scheduled annual screening mammogram Nl breast exam today  Encouraged monthly self exams        Obesity (BMI 30-39.9)      Discussed how this problem influences overall health and the risks it imposes  Reviewed plan for weight loss with lower calorie diet (via better food choices and also portion control or program like weight watchers) and exercise building up to or more than 30 minutes 5 days per week including some  aerobic activity           Routine general medical examination at a health care facility - Primary    Reviewed health habits including diet and exercise and skin cancer prevention Reviewed appropriate screening tests for age  Also reviewed health mt list, fam hx and immunization status , as well as social and family history   See HPI Labs for wellness today  Pap/gyn exam today  Enc healthy diet/exercise and wt loss  She declines flu shots Declines std screening this year      Relevant Orders   CBC with Differential/Platelet   Comprehensive metabolic panel   Lipid panel   TSH   Vitamin D deficiency    D level today  Disc imp of this to bone and overall health       Relevant Orders   VITAMIN D 25 Hydroxy (Vit-D Deficiency, Fractures)

## 2016-12-31 NOTE — Progress Notes (Signed)
Pre visit review using our clinic review tool, if applicable. No additional management support is needed unless otherwise documented below in the visit note. 

## 2016-12-31 NOTE — Patient Instructions (Addendum)
Try to eat less processed food - get your carbs from produce section instead of bread/pasta/rice/snack foods  Get lean protein with each meal (lean meat/nuts/nut butter/soy/dairy) Don't overuse artificial sweeteners    I want you to get a yearly mammogram  We will do a referral and call you about it   Labs today   We will get your pap report back in 1-2 weeks   Take care of yourself

## 2017-01-01 ENCOUNTER — Encounter: Payer: Self-pay | Admitting: *Deleted

## 2017-01-01 LAB — LIPID PANEL
CHOL/HDL RATIO: 3
CHOLESTEROL: 180 mg/dL (ref 0–200)
HDL: 59 mg/dL (ref >39.00)
LDL Cholesterol: 103 mg/dL — ABNORMAL HIGH (ref 0–99)
NonHDL: 121.36
Triglycerides: 93 mg/dL (ref 0.0–149.0)
VLDL: 18.6 mg/dL (ref 0.0–40.0)

## 2017-01-01 LAB — COMPREHENSIVE METABOLIC PANEL
ALBUMIN: 4.4 g/dL (ref 3.5–5.2)
ALT: 13 U/L (ref 0–35)
AST: 22 U/L (ref 0–37)
Alkaline Phosphatase: 61 U/L (ref 39–117)
BUN: 14 mg/dL (ref 6–23)
CO2: 29 mEq/L (ref 19–32)
CREATININE: 0.85 mg/dL (ref 0.40–1.20)
Calcium: 9.8 mg/dL (ref 8.4–10.5)
Chloride: 103 mEq/L (ref 96–112)
GFR: 98.87 mL/min (ref >60.00)
Glucose, Bld: 92 mg/dL (ref 70–99)
Potassium: 4.6 mEq/L (ref 3.5–5.1)
Sodium: 137 mEq/L (ref 135–145)
Total Bilirubin: 0.4 mg/dL (ref 0.2–1.2)
Total Protein: 7.8 g/dL (ref 6.0–8.3)

## 2017-01-01 LAB — CBC WITH DIFFERENTIAL/PLATELET
BASOS ABS: 0.1 10*3/uL (ref 0.0–0.1)
Basophils Relative: 1 % (ref 0.0–3.0)
Eosinophils Absolute: 0.1 10*3/uL (ref 0.0–0.7)
Eosinophils Relative: 1 % (ref 0.0–5.0)
HEMATOCRIT: 36.3 % (ref 36.0–46.0)
HEMOGLOBIN: 11.8 g/dL — AB (ref 12.0–15.0)
Lymphocytes Relative: 31.7 % (ref 12.0–46.0)
Lymphs Abs: 3.1 10*3/uL (ref 0.7–4.0)
MCHC: 32.6 g/dL (ref 30.0–36.0)
MCV: 81.4 fl (ref 78.0–100.0)
Monocytes Absolute: 0.7 10*3/uL (ref 0.1–1.0)
Monocytes Relative: 7 % (ref 3.0–12.0)
Neutro Abs: 5.9 10*3/uL (ref 1.4–7.7)
Neutrophils Relative %: 59.3 % (ref 43.0–77.0)
Platelets: 371 10*3/uL (ref 150.0–400.0)
RBC: 4.46 Mil/uL (ref 3.87–5.11)
RDW: 13.8 % (ref 11.5–15.5)
WBC: 9.9 10*3/uL (ref 4.0–10.5)

## 2017-01-01 LAB — VITAMIN D 25 HYDROXY (VIT D DEFICIENCY, FRACTURES): VITD: 22.42 ng/mL — AB (ref 30.00–100.00)

## 2017-01-01 LAB — TSH: TSH: 2.39 u[IU]/mL (ref 0.35–4.50)

## 2017-01-01 NOTE — Assessment & Plan Note (Signed)
D level today  Disc imp of this to bone and overall health

## 2017-01-01 NOTE — Assessment & Plan Note (Signed)
Scheduled annual screening mammogram Nl breast exam today  Encouraged monthly self exams   Early due to fam hx of breast cancer in mother at 33 yo

## 2017-01-01 NOTE — Assessment & Plan Note (Signed)
Discussed how this problem influences overall health and the risks it imposes  Reviewed plan for weight loss with lower calorie diet (via better food choices and also portion control or program like weight watchers) and exercise building up to or more than 30 minutes 5 days per week including some aerobic activity    

## 2017-01-01 NOTE — Assessment & Plan Note (Signed)
Reviewed health habits including diet and exercise and skin cancer prevention Reviewed appropriate screening tests for age  Also reviewed health mt list, fam hx and immunization status , as well as social and family history   See HPI Labs for wellness today  Pap/gyn exam today  Enc healthy diet/exercise and wt loss  She declines flu shots Declines std screening this year

## 2017-01-01 NOTE — Assessment & Plan Note (Signed)
Mother was diagnosed with breast cancer at 1841 She desires yearly mammogram Scheduled annual screening mammogram Nl breast exam today  Encouraged monthly self exams

## 2017-01-01 NOTE — Assessment & Plan Note (Signed)
Routine gyn exam with pap  Pt declines std screen-not sexually active

## 2017-01-02 LAB — CYTOLOGY - PAP: Diagnosis: NEGATIVE

## 2017-01-05 ENCOUNTER — Encounter: Payer: Self-pay | Admitting: *Deleted

## 2017-02-19 IMAGING — MG 2D DIGITAL SCREENING BILATERAL MAMMOGRAM WITH CAD AND ADJUNCT TO
8 of 12 series · 8 of 28 positions shown · non-contrast
Comparison: Previous exam(s).

CLINICAL DATA: Screening.

EXAM:
2D DIGITAL SCREENING BILATERAL MAMMOGRAM WITH CAD AND ADJUNCT TOMO

[L CC]
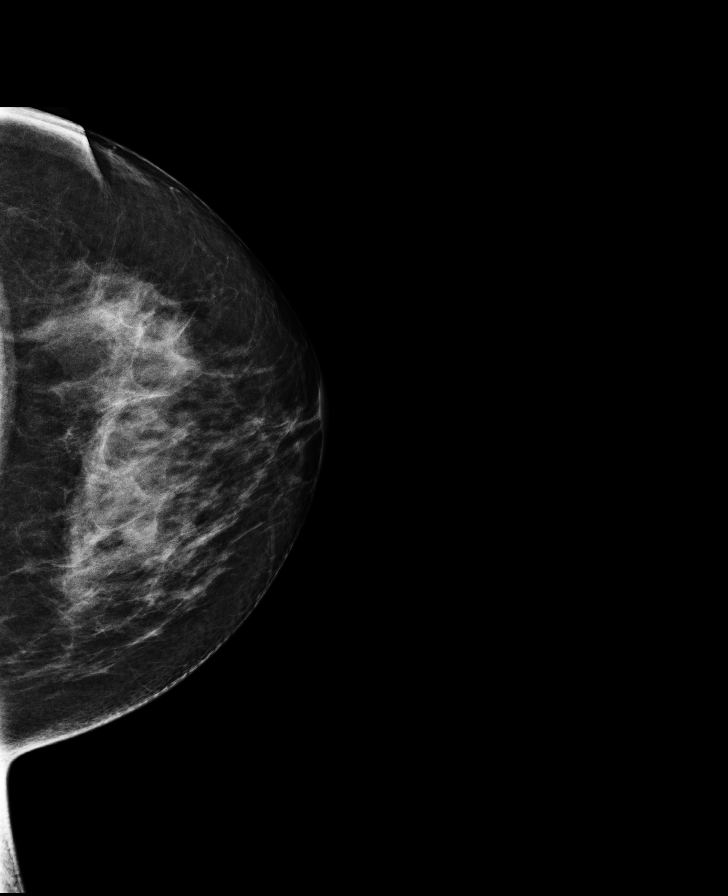

[L MLO]
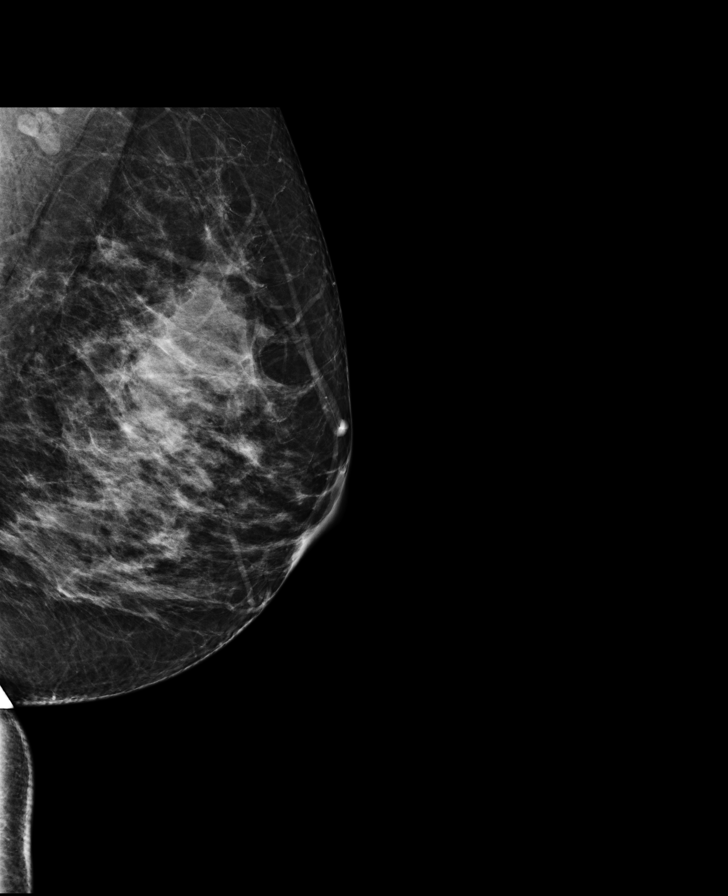

[R MLO]
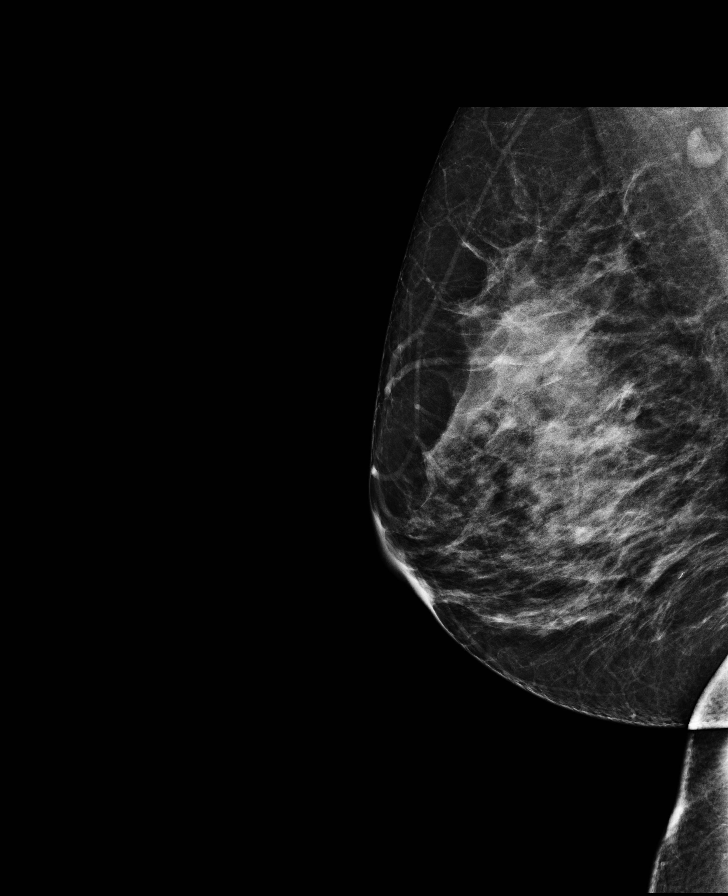

[R MLO synth-2D]
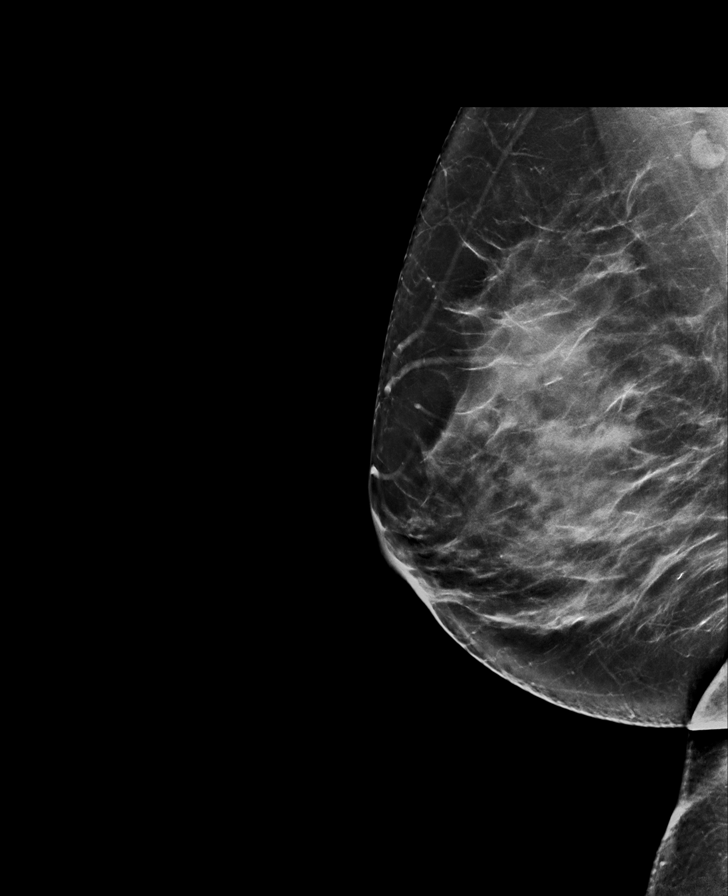

[R CC]
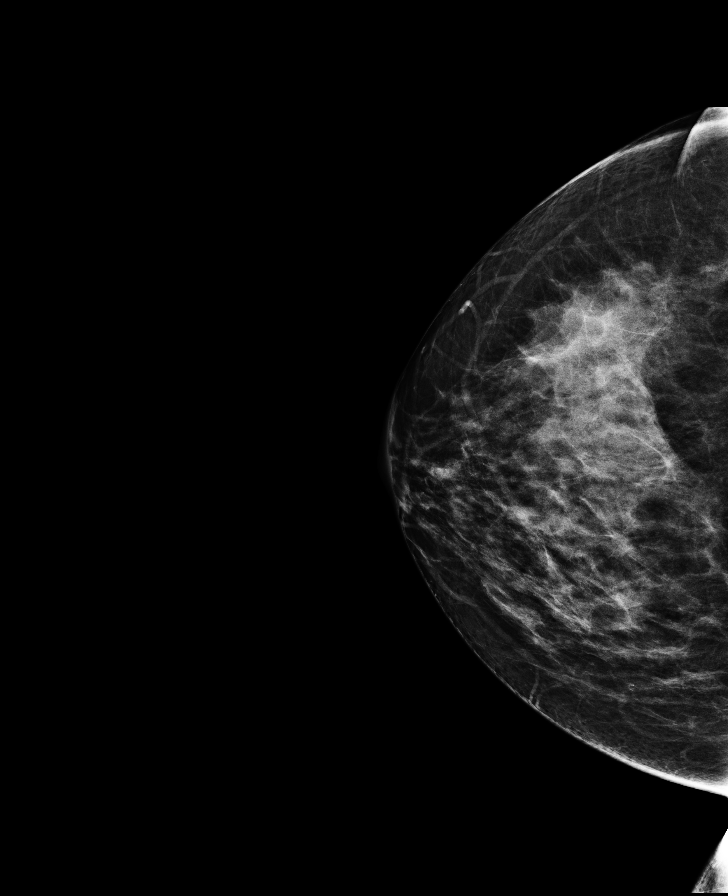

[L MLO synth-2D]
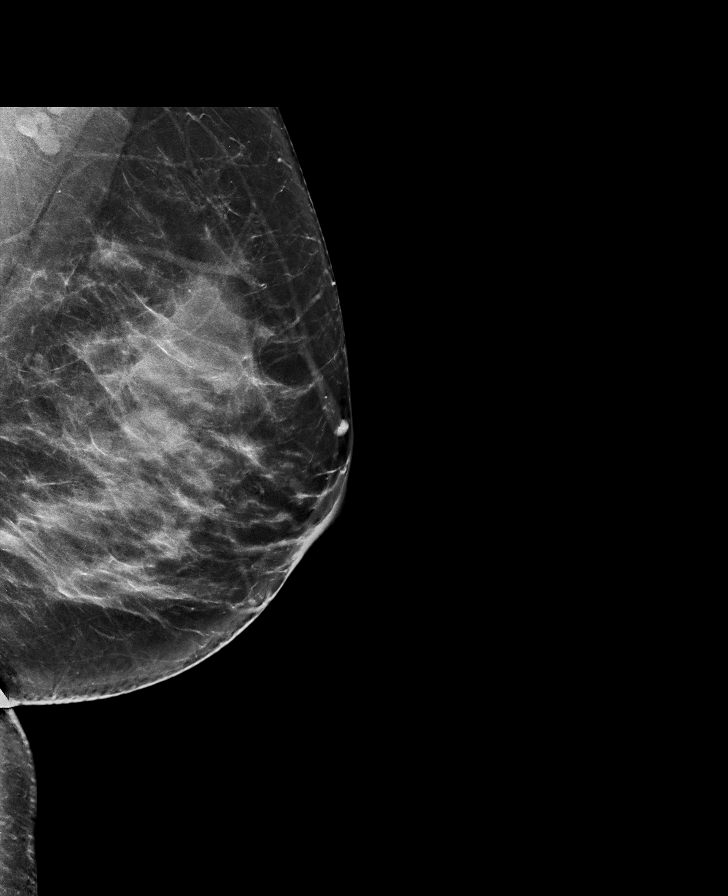

[R CC synth-2D]
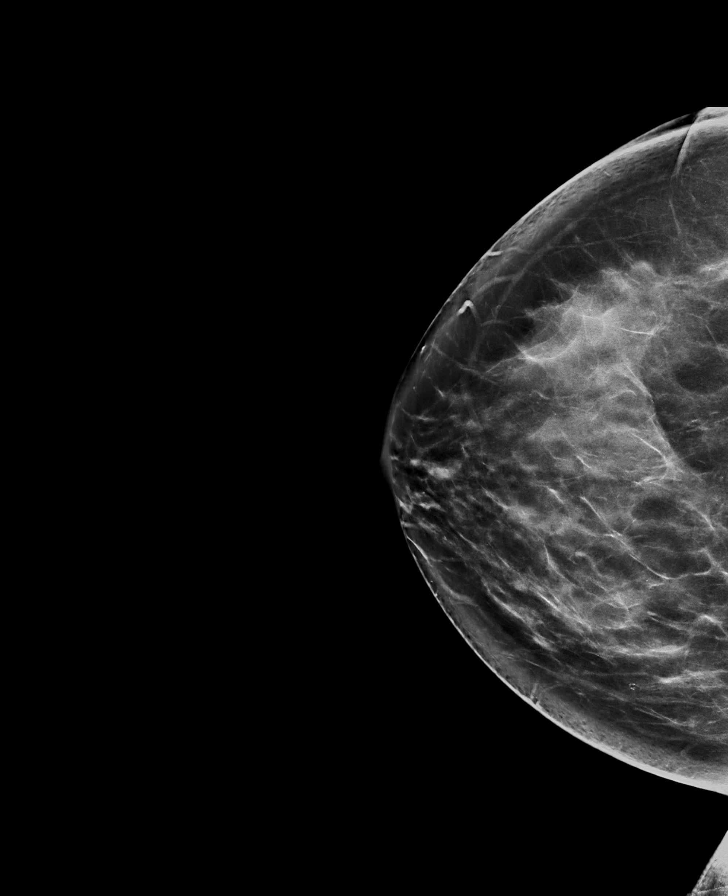

[L CC synth-2D]
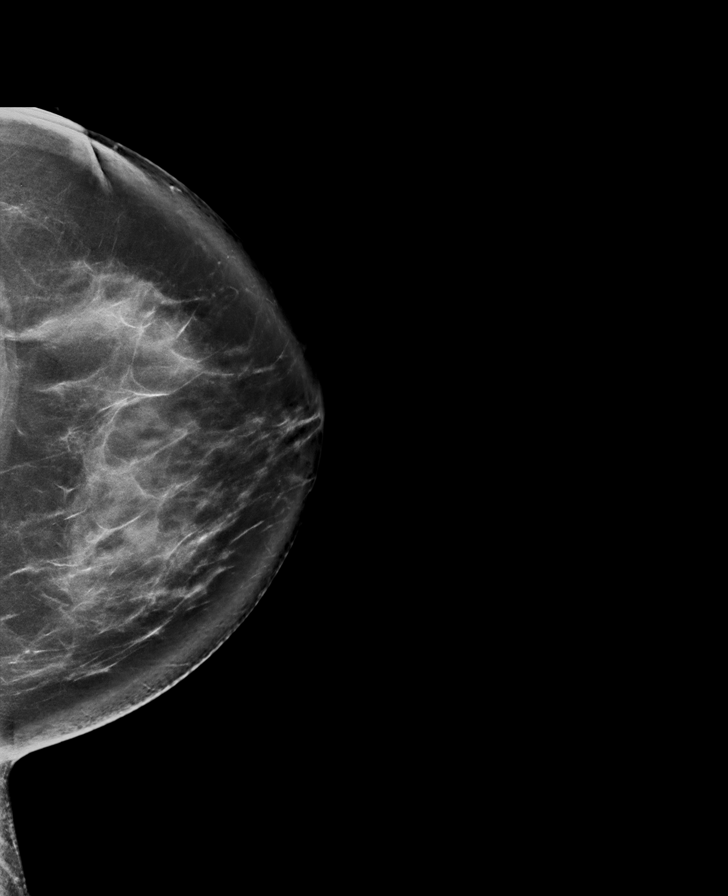

[8 of 28 positions shown; findings below may reference images not displayed]

ACR Breast Density Category c: The breast tissue is heterogeneously
dense, which may obscure small masses.
FINDINGS: There are no findings suspicious for malignancy. Images were
processed with CAD.
IMPRESSION: No mammographic evidence of malignancy. A result letter of this
screening mammogram will be mailed directly to the patient.

RECOMMENDATION:
Screening mammogram at age 38. (Code:B7-7-C2P)

BI-RADS CATEGORY  1: Negative.

## 2017-02-26 ENCOUNTER — Other Ambulatory Visit: Payer: Self-pay | Admitting: Family Medicine

## 2017-06-30 ENCOUNTER — Ambulatory Visit: Payer: BC Managed Care – PPO

## 2017-07-13 ENCOUNTER — Inpatient Hospital Stay: Admission: RE | Admit: 2017-07-13 | Payer: BC Managed Care – PPO | Source: Ambulatory Visit

## 2017-07-16 ENCOUNTER — Ambulatory Visit
Admission: RE | Admit: 2017-07-16 | Discharge: 2017-07-16 | Disposition: A | Discharge status: Home / Self Care | Payer: BC Managed Care – PPO | Source: Ambulatory Visit | Attending: Family Medicine | Admitting: Family Medicine

## 2017-07-16 DIAGNOSIS — Z1231 Encounter for screening mammogram for malignant neoplasm of breast: Secondary | ICD-10-CM

## 2018-01-06 ENCOUNTER — Encounter: Payer: BC Managed Care – PPO | Admitting: Family Medicine

## 2018-03-02 ENCOUNTER — Other Ambulatory Visit: Payer: Self-pay | Admitting: Family Medicine

## 2018-03-02 NOTE — Telephone Encounter (Signed)
Please schedule PE and refill until then  

## 2018-03-02 NOTE — Telephone Encounter (Signed)
Med filled once and Lyla SonCarrie will reach out to pt to get CPE scheduled

## 2018-03-02 NOTE — Telephone Encounter (Signed)
Pt hasn't been seen in over a year and no future appts., please advise  

## 2018-09-08 ENCOUNTER — Encounter: Payer: 59 | Admitting: Family Medicine

## 2018-09-10 ENCOUNTER — Encounter: Payer: Self-pay | Admitting: Family Medicine

## 2018-09-10 ENCOUNTER — Ambulatory Visit (INDEPENDENT_AMBULATORY_CARE_PROVIDER_SITE_OTHER): Payer: 59 | Admitting: Family Medicine

## 2018-09-10 VITALS — BP 136/80 | HR 83 | Temp 98.5°F | Ht 64.25 in | Wt 187.8 lb

## 2018-09-10 DIAGNOSIS — E669 Obesity, unspecified: Secondary | ICD-10-CM | POA: Diagnosis not present

## 2018-09-10 DIAGNOSIS — Z0000 Encounter for general adult medical examination without abnormal findings: Secondary | ICD-10-CM

## 2018-09-10 DIAGNOSIS — E559 Vitamin D deficiency, unspecified: Secondary | ICD-10-CM

## 2018-09-10 DIAGNOSIS — Z1231 Encounter for screening mammogram for malignant neoplasm of breast: Secondary | ICD-10-CM

## 2018-09-10 LAB — COMPREHENSIVE METABOLIC PANEL
ALT: 12 U/L (ref 0–35)
AST: 17 U/L (ref 0–37)
Albumin: 4.2 g/dL (ref 3.5–5.2)
Alkaline Phosphatase: 54 U/L (ref 39–117)
BUN: 16 mg/dL (ref 6–23)
CO2: 27 mEq/L (ref 19–32)
CREATININE: 0.78 mg/dL (ref 0.40–1.20)
Calcium: 9.7 mg/dL (ref 8.4–10.5)
Chloride: 103 mEq/L (ref 96–112)
GFR: 108.08 mL/min (ref >60.00)
Glucose, Bld: 125 mg/dL — ABNORMAL HIGH (ref 70–99)
Potassium: 4.3 mEq/L (ref 3.5–5.1)
SODIUM: 135 meq/L (ref 135–145)
Total Bilirubin: 0.3 mg/dL (ref 0.2–1.2)
Total Protein: 7.2 g/dL (ref 6.0–8.3)

## 2018-09-10 LAB — CBC WITH DIFFERENTIAL/PLATELET
Basophils Absolute: 0.1 10*3/uL (ref 0.0–0.1)
Basophils Relative: 0.6 % (ref 0.0–3.0)
EOS PCT: 0.7 % (ref 0.0–5.0)
Eosinophils Absolute: 0.1 10*3/uL (ref 0.0–0.7)
HCT: 34 % — ABNORMAL LOW (ref 36.0–46.0)
Hemoglobin: 10.9 g/dL — ABNORMAL LOW (ref 12.0–15.0)
LYMPHS ABS: 2.9 10*3/uL (ref 0.7–4.0)
Lymphocytes Relative: 29.6 % (ref 12.0–46.0)
MCHC: 32 g/dL (ref 30.0–36.0)
MCV: 76.9 fl — AB (ref 78.0–100.0)
MONO ABS: 0.5 10*3/uL (ref 0.1–1.0)
MONOS PCT: 4.7 % (ref 3.0–12.0)
NEUTROS PCT: 64.4 % (ref 43.0–77.0)
Neutro Abs: 6.2 10*3/uL (ref 1.4–7.7)
Platelets: 343 10*3/uL (ref 150.0–400.0)
RBC: 4.42 Mil/uL (ref 3.87–5.11)
RDW: 15.3 % (ref 11.5–15.5)
WBC: 9.7 10*3/uL (ref 4.0–10.5)

## 2018-09-10 LAB — VITAMIN D 25 HYDROXY (VIT D DEFICIENCY, FRACTURES): VITD: 23.42 ng/mL — AB (ref 30.00–100.00)

## 2018-09-10 LAB — LIPID PANEL
Cholesterol: 186 mg/dL (ref 0–200)
HDL: 55.2 mg/dL (ref >39.00)
LDL CALC: 105 mg/dL — AB (ref 0–99)
NonHDL: 130.32
TRIGLYCERIDES: 129 mg/dL (ref 0.0–149.0)
Total CHOL/HDL Ratio: 3
VLDL: 25.8 mg/dL (ref 0.0–40.0)

## 2018-09-10 LAB — TSH: TSH: 2.65 u[IU]/mL (ref 0.35–4.50)

## 2018-09-10 MED ORDER — OMEPRAZOLE 20 MG PO CPDR: 20.0000 mg | DELAYED_RELEASE_CAPSULE | Freq: Two times a day (BID) | ORAL | 11 refills | Status: DC

## 2018-09-10 NOTE — Patient Instructions (Addendum)
We will schedule a follow up visit for headaches   Good luck applying for jobs!  For weight loss  Try to get most of your carbohydrates from produce (with the exception of white potatoes)  Eat less bread/pasta/rice/snack foods/cereals/sweets and other items from the middle of the grocery store (processed carbs)  For cholesterol Avoid red meat/ fried foods/ egg yolks/ fatty breakfast meats/ butter, cheese and high fat dairy/ and shellfish    Labs today

## 2018-09-10 NOTE — Progress Notes (Signed)
Subjective:    Patient ID: Ashlee Perry, female    DOB: 16-Dec-1985, 33 y.o.   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 back with family - aunt (busy household)  Happier  Gets to save money   Depression score 1    More headaches- migraine (one sided with nausea)    Wt Readings from Last 3 Encounters:  09/10/18 187 lb 12 oz (85.2 kg)  12/31/16 185 lb (83.9 kg)  10/23/15 179 lb (81.2 kg)  diet - eats 2 meals per day  Wt up 2 lb  Eats too many sweets  Job is very physical so she does not exercise  31.98 kg/m   More acid reflux -burping Does drink soda  On ppi   Mammogram 11/18 (was w/o insurance)  Self breast exam -no lumps  Mother had breast cancer at age 10 Also aunts and gm   Flu vaccine -declines   Pap 5/18  Negative - no new partners  Does not need STD screening  Not sexually active /does not need birth control  Menses -occ a few days earlier or later    Tetanus shot 2/16  H/o vit D def - not taking lately  Takes a supplement multi  Does not get outdoors in the winter   Due for labs   Patient Active Problem List   Diagnosis Date Noted  . Obesity (BMI 30-39.9) 12/31/2016  . Breast cancer screening by mammogram 12/31/2016  . Encounter for routine gynecological examination 10/18/2014  . Screen for STD (sexually transmitted disease) 10/18/2014  . Routine general medical examination at a health care facility 10/01/2014  . Breast tenderness 04/24/2014  . Vitamin D deficiency 04/24/2014  . GERD (gastroesophageal reflux disease) 04/24/2014  . Family history of breast cancer in first degree relative 04/24/2014   Past Medical History:  Diagnosis Date  .  History of gastroesophageal reflux (GERD)    Past Surgical History:  Procedure Laterality Date  . WISDOM TOOTH EXTRACTION  2006   Social History   Tobacco Use  . Smoking status: Never Smoker  . Smokeless tobacco: Never Used  Substance Use Topics  . Alcohol use: Yes    Comment: wine-occ  . Drug use: No   Family History  Problem Relation Age of Onset  . Arthritis Maternal Grandfather   . Kidney failure Maternal Grandfather   . Ovarian cancer Paternal Grandmother   . Breast cancer Mother   . Hypertension Mother   . Prostate cancer Paternal Grandfather   . Hypertension Father   . Diabetes Maternal Grandmother    No Known Allergies Current Outpatient Medications on File Prior to Visit  Medication Sig Dispense Refill  . Multiple Vitamins-Minerals (ALIVE WOMENS GUMMY PO) Take 1 tablet by mouth daily.     No current facility-administered medications on file prior to visit.     Review of Systems  Constitutional: Positive for fatigue. Negative for activity change, appetite change, fever and unexpected weight change.  HENT: Negative for congestion, ear pain, rhinorrhea, sinus pressure and sore throat.   Eyes: Negative for pain, redness and visual disturbance.  Respiratory: Negative for cough, shortness of breath and wheezing.  Cardiovascular: Negative for chest pain and palpitations.  Gastrointestinal: Negative for abdominal pain, blood in stool, constipation and diarrhea.  Endocrine: Negative for polydipsia and polyuria.  Genitourinary: Negative for dysuria, frequency and urgency.  Musculoskeletal: Negative for arthralgias, back pain and myalgias.  Skin: Negative for pallor and rash.  Allergic/Immunologic: Negative for environmental allergies.  Neurological: Positive for headaches. Negative for dizziness, syncope, weakness, light-headedness and numbness.  Hematological: Negative for adenopathy. Does not bruise/bleed easily.  Psychiatric/Behavioral: Negative for decreased  concentration and dysphoric mood. The patient is not nervous/anxious.        Very stressed lately        Objective:   Physical Exam Constitutional:      General: She is not in acute distress.    Appearance: Normal appearance. She is well-developed. She is obese.  HENT:     Head: Normocephalic and atraumatic.     Right Ear: Tympanic membrane, ear canal and external ear normal.     Left Ear: Tympanic membrane, ear canal and external ear normal.     Nose: Nose normal.     Mouth/Throat:     Mouth: Mucous membranes are moist.     Pharynx: Oropharynx is clear. No posterior oropharyngeal erythema.  Eyes:     General: No scleral icterus.    Conjunctiva/sclera: Conjunctivae normal.     Pupils: Pupils are equal, round, and reactive to light.  Neck:     Musculoskeletal: Normal range of motion and neck supple.     Thyroid: No thyromegaly.     Vascular: No carotid bruit or JVD.  Cardiovascular:     Rate and Rhythm: Normal rate and regular rhythm.     Pulses: Normal pulses.     Heart sounds: Normal heart sounds. No gallop.   Pulmonary:     Effort: Pulmonary effort is normal. No respiratory distress.     Breath sounds: Normal breath sounds. No wheezing.  Chest:     Chest wall: No tenderness.  Abdominal:     General: Bowel sounds are normal. There is no distension or abdominal bruit.     Palpations: Abdomen is soft. There is no mass.     Tenderness: There is no abdominal tenderness.  Genitourinary:    Comments: Breast exam: No mass, nodules, thickening, tenderness, bulging, retraction, inflamation, nipple discharge or skin changes noted.  No axillary or clavicular LA.     Musculoskeletal: Normal range of motion.        General: No tenderness.     Right lower leg: No edema.     Left lower leg: No edema.  Lymphadenopathy:     Cervical: No cervical adenopathy.  Skin:    General: Skin is warm and dry.     Capillary Refill: Capillary refill takes less than 2 seconds.     Coloration: Skin  is not pale.     Findings: No erythema or rash.     Comments: Tattoos baseline   Neurological:     General: No focal deficit present.     Mental Status: She is alert. Mental status is at baseline.     Cranial Nerves: No cranial nerve deficit.     Motor: No abnormal muscle tone.     Coordination: Coordination normal.     Deep Tendon Reflexes: Reflexes are normal and symmetric. Reflexes normal.  Psychiatric:        Mood and Affect: Mood normal.     Comments: Voices many stressors  Assessment & Plan:   Problem List Items Addressed This Visit      Other   Vitamin D deficiency    Level today  She has not been taking her supplement lately      Relevant Orders   VITAMIN D 25 Hydroxy (Vit-D Deficiency, Fractures) (Completed)   Routine general medical examination at a health care facility - Primary    Reviewed health habits including diet and exercise and skin cancer prevention Reviewed appropriate screening tests for age  Also reviewed health mt list, fam hx and immunization status , as well as social and family history   See HPI Labs ordered  Mammogram ordered  She will return for a visit re: headaches soon      Relevant Orders   CBC with Differential/Platelet (Completed)   Comprehensive metabolic panel (Completed)   Lipid panel (Completed)   TSH (Completed)   Obesity (BMI 30-39.9)    Discussed how this problem influences overall health and the risks it imposes  Reviewed plan for weight loss with lower calorie diet (via better food choices and also portion control or program like weight watchers) and exercise building up to or more than 30 minutes 5 days per week including some aerobic activity         Breast cancer screening by mammogram    Scheduled annual screening mammogram Nl breast exam today  Encouraged monthly self exams        Relevant Orders   MM 3D SCREEN BREAST BILATERAL

## 2018-09-12 NOTE — Assessment & Plan Note (Signed)
Scheduled annual screening mammogram Nl breast exam today  Encouraged monthly self exams   

## 2018-09-12 NOTE — Assessment & Plan Note (Signed)
Level today  She has not been taking her supplement lately

## 2018-09-12 NOTE — Assessment & Plan Note (Signed)
Reviewed health habits including diet and exercise and skin cancer prevention Reviewed appropriate screening tests for age  Also reviewed health mt list, fam hx and immunization status , as well as social and family history   See HPI Labs ordered  Mammogram ordered  She will return for a visit re: headaches soon

## 2018-09-12 NOTE — Assessment & Plan Note (Signed)
Discussed how this problem influences overall health and the risks it imposes  Reviewed plan for weight loss with lower calorie diet (via better food choices and also portion control or program like weight watchers) and exercise building up to or more than 30 minutes 5 days per week including some aerobic activity    

## 2018-09-13 ENCOUNTER — Other Ambulatory Visit (INDEPENDENT_AMBULATORY_CARE_PROVIDER_SITE_OTHER): Payer: 59

## 2018-09-13 ENCOUNTER — Encounter: Payer: Self-pay | Admitting: Family Medicine

## 2018-09-13 ENCOUNTER — Encounter: Payer: Self-pay | Admitting: *Deleted

## 2018-09-13 DIAGNOSIS — R7309 Other abnormal glucose: Secondary | ICD-10-CM

## 2018-09-13 DIAGNOSIS — R7303 Prediabetes: Secondary | ICD-10-CM | POA: Insufficient documentation

## 2018-09-13 LAB — HEMOGLOBIN A1C: Hgb A1c MFr Bld: 6.2 % (ref 4.6–6.5)

## 2018-09-20 ENCOUNTER — Encounter: Payer: Self-pay | Admitting: Family Medicine

## 2018-09-20 ENCOUNTER — Ambulatory Visit (INDEPENDENT_AMBULATORY_CARE_PROVIDER_SITE_OTHER): Payer: 59 | Admitting: Family Medicine

## 2018-09-20 VITALS — BP 120/80 | HR 75 | Temp 97.7°F | Ht 64.25 in | Wt 186.5 lb

## 2018-09-20 DIAGNOSIS — R7303 Prediabetes: Secondary | ICD-10-CM

## 2018-09-20 DIAGNOSIS — D509 Iron deficiency anemia, unspecified: Secondary | ICD-10-CM | POA: Diagnosis not present

## 2018-09-20 DIAGNOSIS — E559 Vitamin D deficiency, unspecified: Secondary | ICD-10-CM | POA: Diagnosis not present

## 2018-09-20 DIAGNOSIS — G43009 Migraine without aura, not intractable, without status migrainosus: Secondary | ICD-10-CM

## 2018-09-20 MED ORDER — TOPIRAMATE 25 MG PO TABS: 25.0000 mg | ORAL_TABLET | Freq: Two times a day (BID) | ORAL | 3 refills | Status: DC

## 2018-09-20 MED ORDER — SUMATRIPTAN SUCCINATE 100 MG PO TABS: 100.0000 mg | ORAL_TABLET | Freq: Once | ORAL | 3 refills | Status: DC

## 2018-09-20 NOTE — Assessment & Plan Note (Signed)
Suspect due to change in menses Adv ferrous sulfate 325 mg daily for 2 mo then several times weekly  Will monitor

## 2018-09-20 NOTE — Patient Instructions (Addendum)
You need some extra iron  You can buy ferrous sulfate 325 mg over the counter - take it daily for 2 months and several times weekly   Vitamin D is low  Get vitamin D over the counter - 2000 iu daily from now on  Then take the px weekly for 12 weeks  It is important for your bones   To prevent diabetes Try to get most of your carbohydrates from produce (with the exception of white potatoes)  Eat less bread/pasta/rice/snack foods/cereals/sweets and other items from the middle of the grocery store (processed carbs)   Aim for 64 oz of water per day  Minimize caffeine  Try to go to bed and get up at the same time every day   Please pursue a new job   Start topamax to prevent headaches  25 mg at bedtime for 1 week  Then 25 mg twice daily (am and pm)   Aleve or ibuprofen  Try imitrex for severe headaches

## 2018-09-20 NOTE — Assessment & Plan Note (Signed)
Level of 23 Px ergocalciferol 50,000 iu weekly for 12 weeks Continue 2000 iu daily  Disc imp for bone and overall health

## 2018-09-20 NOTE — Assessment & Plan Note (Signed)
Worsened by stress and long work hours (also lack of self care)  Disc habits to reduce migraine and handout given  Inc water/dec caffeine FMLA for work to go down to 8 hour days  Px topamax 25 mg qhs for 1 week and then bid for prophylaxis  imitrex prn mod to severe migraine  Disc poss of rebound as well  F/u 1 mo or earlier if needed Reassuring exam

## 2018-09-20 NOTE — Progress Notes (Signed)
Subjective:    Patient ID: Ashlee Perry, female    DOB: 01/03/1986, 33 y.o.   MRN: [id number]  HPI Here for headaches  Also to review labs from PE  Wt Readings from Last 3 Encounters:  09/20/18 186 lb 8 oz (84.6 kg)  09/10/18 187 lb 12 oz (85.2 kg)  12/31/16 185 lb (83.9 kg)   31.76 kg/m   Migraines started in October  Thinks they are from stress and being overworked (12 hour days without days off)  Makes it difficult to work and sleep Throbbing headache - frontal rad to the back  Sharp  One side- always on the left (behind eye -then back of head and neck)  Sometimes wakes up with them sometimes does not  Gets nauseated / does not vomit  Gets sensitive to light and sound   Headaches last until she goes to sleep (1/2 to 1 day)  They do get worse during menses    Works at [employer]  Gets 4-8 hours per night  Feels like she needs to sleep 12 hours per day   Looking for a new job (this job is not good for her health)  There will be a conversion at work and then she can move   Aleve and ibuprofen help the most - does not want to take as much   Drinks coffee- 1 cup per day (small)  Does not drink enough water -but loves it     Blood pressure BP Readings from Last 3 Encounters:  09/20/18 (!) 126/92  09/10/18 136/80  12/31/16 122/80   Labs: Results for orders placed or performed in visit on 09/13/18  Hemoglobin A1c  Result Value Ref Range   Hgb A1c MFr Bld 6.2 4.6 - 6.5 %    Lab Results  Component Value Date   WBC 9.7 09/10/2018   HGB 10.9 (L) 09/10/2018   HCT 34.0 (L) 09/10/2018   MCV 76.9 (L) 09/10/2018   PLT 343.0 09/10/2018   Lab Results  Component Value Date   CREATININE 0.78 09/10/2018   BUN 16 09/10/2018   NA 135 09/10/2018   K 4.3 09/10/2018   CL 103 09/10/2018   CO2 27 09/10/2018   Lab Results  Component Value Date   ALT 12 09/10/2018   AST 17 09/10/2018   ALKPHOS 54 09/10/2018   BILITOT 0.3 09/10/2018   Lab Results  Component Value  Date   CHOL 186 09/10/2018   HDL 55.20 09/10/2018   LDLCALC 105 (H) 09/10/2018   TRIG 129.0 09/10/2018   CHOLHDL 3 09/10/2018   Glucose 125  Lab Results  Component Value Date   TSH 2.65 09/10/2018    Low D at 23.4 We sent in ergocalciferol weekly for 12 weeks  inst to also take 2000 iu daily from now on   Mild anemia  Periods vary- are a little heavier than they used to be   Prediabetes Lab Results  Component Value Date   HGBA1C 6.2 09/13/2018   Patient Active Problem List   Diagnosis Date Noted  . Iron deficiency anemia 09/20/2018  . Migraine headache without aura 09/20/2018  . Prediabetes 09/13/2018  . Obesity (BMI 30-39.9) 12/31/2016  . Breast cancer screening by mammogram 12/31/2016  . Encounter for routine gynecological examination 10/18/2014  . Screen for STD (sexually transmitted disease) 10/18/2014  . Routine general medical examination at a health care facility 10/01/2014  . Vitamin D deficiency 04/24/2014  . GERD (gastroesophageal reflux disease) 04/24/2014  . Family  history of breast cancer in first degree relative 04/24/2014   Past Medical History:  Diagnosis Date  . History of gastroesophageal reflux (GERD)    Past Surgical History:  Procedure Laterality Date  . WISDOM TOOTH EXTRACTION  2006   Social History   Tobacco Use  . Smoking status: Never Smoker  . Smokeless tobacco: Never Used  Substance Use Topics  . Alcohol use: Yes    Comment: wine-occ  . Drug use: No   Family History  Problem Relation Age of Onset  . Arthritis Maternal Grandfather   . Kidney failure Maternal Grandfather   . Ovarian cancer Paternal Grandmother   . Breast cancer Mother   . Hypertension Mother   . Prostate cancer Paternal Grandfather   . Hypertension Father   . Diabetes Maternal Grandmother    No Known Allergies Current Outpatient Medications on File Prior to Visit  Medication Sig Dispense Refill  . Multiple Vitamins-Minerals (ALIVE WOMENS GUMMY PO) Take 1  tablet by mouth daily.    Marland Kitchen omeprazole (PRILOSEC) 20 MG capsule Take 1 capsule (20 mg total) by mouth 2 (two) times daily. Needs physical appt. before future refills are given 60 capsule 11   No current facility-administered medications on file prior to visit.     Review of Systems  Constitutional: Positive for fatigue. Negative for activity change, appetite change, fever and unexpected weight change.  HENT: Negative for congestion, ear pain, rhinorrhea, sinus pressure and sore throat.   Eyes: Negative for pain, redness and visual disturbance.  Respiratory: Negative for cough, shortness of breath and wheezing.   Cardiovascular: Negative for chest pain and palpitations.  Gastrointestinal: Positive for nausea. Negative for abdominal pain, blood in stool, constipation and diarrhea.  Endocrine: Negative for polydipsia and polyuria.  Genitourinary: Negative for dysuria, frequency and urgency.  Musculoskeletal: Negative for arthralgias, back pain and myalgias.  Skin: Negative for pallor and rash.  Allergic/Immunologic: Negative for environmental allergies.  Neurological: Positive for headaches. Negative for dizziness, tremors, seizures, syncope, facial asymmetry, speech difficulty, weakness, light-headedness and numbness.  Hematological: Negative for adenopathy. Does not bruise/bleed easily.  Psychiatric/Behavioral: Negative for decreased concentration and dysphoric mood. The patient is not nervous/anxious.        Stressors at work       Objective:   Physical Exam Constitutional:      General: She is not in acute distress.    Appearance: She is well-developed. She is obese. She is ill-appearing. She is not diaphoretic.  HENT:     Head: Normocephalic and atraumatic.     Right Ear: Tympanic membrane and external ear normal.     Left Ear: Tympanic membrane and external ear normal.     Nose: Nose normal.     Mouth/Throat:     Mouth: Mucous membranes are moist.     Pharynx: Oropharynx is  clear. No oropharyngeal exudate or posterior oropharyngeal erythema.  Eyes:     General: No scleral icterus.       Right eye: No discharge.        Left eye: No discharge.     Conjunctiva/sclera: Conjunctivae normal.     Pupils: Pupils are equal, round, and reactive to light.     Comments: No nystagmus  Neck:     Musculoskeletal: Full passive range of motion without pain, normal range of motion and neck supple.     Thyroid: No thyromegaly.     Vascular: No carotid bruit or JVD.     Trachea: No tracheal deviation.  Cardiovascular:     Rate and Rhythm: Normal rate and regular rhythm.     Heart sounds: Normal heart sounds. No murmur.  Pulmonary:     Effort: Pulmonary effort is normal. No respiratory distress.     Breath sounds: Normal breath sounds. No wheezing or rales.  Abdominal:     General: Bowel sounds are normal. There is no distension.     Palpations: Abdomen is soft. There is no mass.     Tenderness: There is no abdominal tenderness.  Musculoskeletal:        General: No tenderness.  Lymphadenopathy:     Cervical: No cervical adenopathy.  Skin:    General: Skin is warm and dry.     Coloration: Skin is not pale.     Findings: No rash.  Neurological:     Mental Status: She is alert and oriented to person, place, and time.     Cranial Nerves: No cranial nerve deficit, dysarthria or facial asymmetry.     Sensory: Sensation is intact. No sensory deficit.     Motor: Motor function is intact. No weakness, tremor, atrophy, abnormal muscle tone, seizure activity or pronator drift.     Coordination: Romberg sign negative. Coordination normal. Finger-Nose-Finger Test normal.     Gait: Gait normal.     Deep Tendon Reflexes: Reflexes are normal and symmetric.     Comments: No focal cerebellar signs   Psychiatric:        Behavior: Behavior normal.        Thought Content: Thought content normal.           Assessment & Plan:   Problem List Items Addressed This Visit       Cardiovascular and Mediastinum   Migraine headache without aura - Primary    Worsened by stress and long work hours (also lack of self care)  Disc habits to reduce migraine and handout given  Inc water/dec caffeine FMLA for work to go down to 8 hour days  Px topamax 25 mg qhs for 1 week and then bid for prophylaxis  imitrex prn mod to severe migraine  Disc poss of rebound as well  F/u 1 mo or earlier if needed Reassuring exam       Relevant Medications   topiramate (TOPAMAX) 25 MG tablet   SUMAtriptan (IMITREX) 100 MG tablet     Other   Vitamin D deficiency    Level of 23 Px ergocalciferol 50,000 iu weekly for 12 weeks Continue 2000 iu daily  Disc imp for bone and overall health       Prediabetes    Lab Results  Component Value Date   HGBA1C 6.2 09/13/2018   disc imp of low glycemic diet and wt loss to prevent DM2        Iron deficiency anemia    Suspect due to change in menses Adv ferrous sulfate 325 mg daily for 2 mo then several times weekly  Will monitor

## 2018-09-20 NOTE — Assessment & Plan Note (Signed)
Lab Results  Component Value Date   HGBA1C 6.2 09/13/2018   disc imp of low glycemic diet and wt loss to prevent DM2  

## 2018-10-11 ENCOUNTER — Ambulatory Visit
Admission: RE | Admit: 2018-10-11 | Discharge: 2018-10-11 | Disposition: A | Discharge status: Home / Self Care | Payer: 59 | Source: Ambulatory Visit | Attending: Family Medicine | Admitting: Family Medicine

## 2018-10-11 DIAGNOSIS — Z1231 Encounter for screening mammogram for malignant neoplasm of breast: Secondary | ICD-10-CM

## 2018-10-13 ENCOUNTER — Other Ambulatory Visit: Payer: Self-pay | Admitting: Family Medicine

## 2018-10-13 DIAGNOSIS — R928 Other abnormal and inconclusive findings on diagnostic imaging of breast: Secondary | ICD-10-CM

## 2018-10-15 ENCOUNTER — Ambulatory Visit: Payer: 59

## 2018-10-15 ENCOUNTER — Ambulatory Visit
Admission: RE | Admit: 2018-10-15 | Discharge: 2018-10-15 | Disposition: A | Discharge status: Home / Self Care | Payer: 59 | Source: Ambulatory Visit | Attending: Family Medicine | Admitting: Family Medicine

## 2018-10-15 DIAGNOSIS — R928 Other abnormal and inconclusive findings on diagnostic imaging of breast: Secondary | ICD-10-CM

## 2018-10-21 ENCOUNTER — Telehealth: Payer: Self-pay

## 2018-10-21 NOTE — Telephone Encounter (Signed)
Aware. 

## 2018-10-21 NOTE — Telephone Encounter (Signed)
Pt said she has authorization # to see Dr Milinda Antis for workers comp; pt hurt her back about 1 month ago while lifting heavy boxes. Pt said hurts worse when she bends. Pt has appt 10/25/18 already scheduled. FYI to Dr Milinda Antis.

## 2018-10-25 ENCOUNTER — Ambulatory Visit (INDEPENDENT_AMBULATORY_CARE_PROVIDER_SITE_OTHER): Payer: 59 | Admitting: Family Medicine

## 2018-10-25 ENCOUNTER — Encounter: Payer: Self-pay | Admitting: Family Medicine

## 2018-10-25 ENCOUNTER — Ambulatory Visit (INDEPENDENT_AMBULATORY_CARE_PROVIDER_SITE_OTHER)
Admission: RE | Admit: 2018-10-25 | Discharge: 2018-10-25 | Disposition: A | Discharge status: Home / Self Care | Payer: 59 | Source: Ambulatory Visit | Attending: Family Medicine | Admitting: Family Medicine

## 2018-10-25 VITALS — BP 134/76 | HR 76 | Temp 97.2°F | Ht 64.25 in | Wt 184.4 lb

## 2018-10-25 DIAGNOSIS — M545 Low back pain, unspecified: Secondary | ICD-10-CM

## 2018-10-25 DIAGNOSIS — G43009 Migraine without aura, not intractable, without status migrainosus: Secondary | ICD-10-CM | POA: Diagnosis not present

## 2018-10-25 MED ORDER — METHOCARBAMOL 500 MG PO TABS: 500.0000 mg | ORAL_TABLET | Freq: Three times a day (TID) | ORAL | 1 refills | Status: DC | PRN

## 2018-10-25 NOTE — Assessment & Plan Note (Signed)
Doing significantly better with topamax and not working over 8 hours per day

## 2018-10-25 NOTE — Patient Instructions (Addendum)
Xray today   Do not lift over 20 lb  Lift with your legs, (not your back)  Continue ice/heat if it helps  As well as stretching at home Ibuprofen is ok to take as directed with food    If back pain worsens you can try the methocarbamol muscle relaxer   If worse- let's consider physical therapy (please call and let me know)  Take care of yourself

## 2018-10-25 NOTE — Progress Notes (Signed)
Subjective:    Patient ID: Ashlee Perry, female    DOB: 1985-12-08, 33 y.o.   MRN: [id number]  HPI Here for back pain for 1 month  This is workman's comp covered   Hurt her back at work  Picking up heavy things - threw over her head (approx 50 lb) box vitcose into a bin that is high up  (approx 09/23/18)  She is short so she has to strain to do it   Pain started about a week later  Left lower back  Is starting to improve   Had to call in to work a few times   Stretching  Walking  Using ice and heat    Hurts to bend forward (has tried to use legs to lift)  Ok to bend backwards  Standing - can do for 8-9 hours before pain  Sitting - 8-10 hours before pain  Can lift (with knees and not going over head) about 20 lb  Walking helps   On pain scale 3/10  At very worse 9/10 (throbbing)   No numbness or weakness in leg or foot   She has applied for jobs as well as her old job -wants to change  Is I the cue- it takes a while    Otc: took ibuprofen  Took a pill of her aunt's -muscle relaxer and it made her sleepy    Migraines are doing better now that she is not working more than 8 hour shifts  Also topamax and less caffeine   Patient Active Problem List   Diagnosis Date Noted  . Low back pain 10/25/2018  . Iron deficiency anemia 09/20/2018  . Migraine headache without aura 09/20/2018  . Prediabetes 09/13/2018  . Obesity (BMI 30-39.9) 12/31/2016  . Breast cancer screening by mammogram 12/31/2016  . Encounter for routine gynecological examination 10/18/2014  . Screen for STD (sexually transmitted disease) 10/18/2014  . Routine general medical examination at a health care facility 10/01/2014  . Vitamin D deficiency 04/24/2014  . GERD (gastroesophageal reflux disease) 04/24/2014  . Family history of breast cancer in first degree relative 04/24/2014   Past Medical History:  Diagnosis Date  . History of gastroesophageal reflux (GERD)    Past Surgical History:    Procedure Laterality Date  . WISDOM TOOTH EXTRACTION  2006   Social History   Tobacco Use  . Smoking status: Never Smoker  . Smokeless tobacco: Never Used  Substance Use Topics  . Alcohol use: Yes    Comment: wine-occ  . Drug use: No   Family History  Problem Relation Age of Onset  . Arthritis Maternal Grandfather   . Kidney failure Maternal Grandfather   . Ovarian cancer Paternal Grandmother   . Breast cancer Mother 77  . Hypertension Mother   . Prostate cancer Paternal Grandfather   . Hypertension Father   . Diabetes Maternal Grandmother    No Known Allergies Current Outpatient Medications on File Prior to Visit  Medication Sig Dispense Refill  . Multiple Vitamins-Minerals (ALIVE WOMENS GUMMY PO) Take 1 tablet by mouth daily.    Marland Kitchen omeprazole (PRILOSEC) 20 MG capsule Take 1 capsule (20 mg total) by mouth 2 (two) times daily. Needs physical appt. before future refills are given 60 capsule 11  . topiramate (TOPAMAX) 25 MG tablet Take 1 tablet (25 mg total) by mouth 2 (two) times daily. 60 tablet 3  . SUMAtriptan (IMITREX) 100 MG tablet Take 1 tablet (100 mg total) by mouth once for 1  dose. 10 tablet 3   No current facility-administered medications on file prior to visit.     Review of Systems  Constitutional: Negative for activity change, appetite change, fatigue, fever and unexpected weight change.  HENT: Negative for congestion, ear pain, rhinorrhea, sinus pressure and sore throat.   Eyes: Negative for pain, redness and visual disturbance.  Respiratory: Negative for cough, shortness of breath and wheezing.   Cardiovascular: Negative for chest pain and palpitations.  Gastrointestinal: Negative for abdominal pain, blood in stool, constipation and diarrhea.  Endocrine: Negative for polydipsia and polyuria.  Genitourinary: Negative for dysuria, frequency and urgency.  Musculoskeletal: Positive for back pain. Negative for arthralgias and myalgias.  Skin: Negative for pallor  and rash.  Allergic/Immunologic: Negative for environmental allergies.  Neurological: Positive for headaches. Negative for dizziness and syncope.  Hematological: Negative for adenopathy. Does not bruise/bleed easily.  Psychiatric/Behavioral: Negative for decreased concentration and dysphoric mood. The patient is not nervous/anxious.        Objective:   Physical Exam Constitutional:      General: She is not in acute distress.    Appearance: Normal appearance. She is obese. She is not ill-appearing.  HENT:     Head: Normocephalic and atraumatic.     Mouth/Throat:     Mouth: Mucous membranes are moist.     Pharynx: Oropharynx is clear.  Eyes:     Conjunctiva/sclera: Conjunctivae normal.     Pupils: Pupils are equal, round, and reactive to light.  Neck:     Musculoskeletal: Normal range of motion. No neck rigidity.  Cardiovascular:     Rate and Rhythm: Normal rate and regular rhythm.  Pulmonary:     Effort: Pulmonary effort is normal. No respiratory distress.     Breath sounds: Normal breath sounds. No wheezing or rales.  Abdominal:     General: Abdomen is flat.  Musculoskeletal:     Lumbar back: She exhibits decreased range of motion and tenderness. She exhibits no bony tenderness, no edema, no deformity and normal pulse.     Right lower leg: No edema.     Left lower leg: No edema.     Comments: Tender over L lumbar musculature with tightness Nl rom hips Neg SLR Nl gait  Flex- 90 deg with pain  Ext and lateral bend nl  Nl rom UEs and LEs   No rash/skin change  No crepitus No neuro changes   Lymphadenopathy:     Cervical: No cervical adenopathy.  Skin:    General: Skin is warm and dry.     Coloration: Skin is not pale.     Findings: No rash.  Neurological:     General: No focal deficit present.     Mental Status: She is alert.     Motor: No weakness.     Coordination: Coordination normal.     Gait: Gait normal.     Deep Tendon Reflexes: Reflexes normal.    Psychiatric:        Mood and Affect: Mood normal.           Assessment & Plan:   Problem List Items Addressed This Visit      Cardiovascular and Mediastinum   Migraine headache without aura    Doing significantly better with topamax and not working over 8 hours per day        Relevant Medications   methocarbamol (ROBAXIN) 500 MG tablet     Other   Low back pain - Primary  Pt thinks this was caused by lifting overhead (about 50 lb) at work  Improved now with stretching/heat/ice/ibuprofen and avoidance of heavy lifting  Disc proper lifting technique  Until pain is gone -enc her to avoid lifting over 20 lb  Handout with stretches given Xray today  Reassuring exam without s/s of radiculopathy  Suspect muscle strain that is improving Px muscle relaxer (methocarbamol) for prn use with caution of sedation -she will fill only if she needs it If symptoms worsen would recommend PT      Relevant Medications   methocarbamol (ROBAXIN) 500 MG tablet   Other Relevant Orders   DG Lumbar Spine Complete

## 2018-10-25 NOTE — Assessment & Plan Note (Signed)
Pt thinks this was caused by lifting overhead (about 50 lb) at work  Improved now with stretching/heat/ice/ibuprofen and avoidance of heavy lifting  Disc proper lifting technique  Until pain is gone -enc her to avoid lifting over 20 lb  Handout with stretches given Xray today  Reassuring exam without s/s of radiculopathy  Suspect muscle strain that is improving Px muscle relaxer (methocarbamol) for prn use with caution of sedation -she will fill only if she needs it If symptoms worsen would recommend PT

## 2018-12-17 ENCOUNTER — Telehealth: Payer: Self-pay | Admitting: Family Medicine

## 2018-12-17 NOTE — Telephone Encounter (Signed)
Best number [phone] Pt called stating she needs a letter/note stating  She has come off her restriction of working 8 hours per day

## 2018-12-20 ENCOUNTER — Encounter: Payer: Self-pay | Admitting: Family Medicine

## 2018-12-20 NOTE — Telephone Encounter (Signed)
Letter done and in IN box 

## 2018-12-20 NOTE — Telephone Encounter (Signed)
Pt notified letter ready for pick up. 

## 2018-12-20 NOTE — Telephone Encounter (Signed)
Left VM letting pt know letter is ready for pick up 

## 2018-12-20 NOTE — Telephone Encounter (Signed)
Pt came into the office to pick up letter. She reviewed it and asked that Dr. Milinda Antis re-write the letter, with today's date if possible.   The original letter says: "My patient above was previously limited to an 8 hour work day due to health conditions.  She is now released from this restriction and can work more than 8 hours daily."  But pt said she needs the letter to say: My patient above was previously limited to an 8 hour work day due to health conditions.  She is now released from this restriction and can work more than 8 hours daily, but if she develops a severe migraine headache at any time she is excused to leave immediately.  Please advise

## 2018-12-20 NOTE — Telephone Encounter (Signed)
Done

## 2019-03-11 ENCOUNTER — Telehealth: Payer: Self-pay

## 2019-03-11 NOTE — Telephone Encounter (Signed)
Pt aware I have not received any fmla papework

## 2019-03-11 NOTE — Telephone Encounter (Signed)
[place] Night - Client Nonclinical Telephone Record AccessNurse Client [facility] Night - Client Client Site [place] Physician Loura Pardon - MD Contact Type Call Who Is Calling Patient / Member / Family / Caregiver Caller Name Eleva Phone Number [phone] Patient Name Ashtynn Berke Patient DOB Dec 14, 1985 Call Type Message Only Information Provided Reason for Call Request for General Office Information Initial Comment Caller states she is calling regarding her FMLA paperwork and would like to know if the office has submitted it. Additional Comment Caller states she will call back during business hours. Call Closed By: Jamal Maes Transaction Date/Time: 03/10/2019 5:01:15 PM (ET)

## 2019-09-20 ENCOUNTER — Other Ambulatory Visit: Payer: Self-pay | Admitting: Family Medicine

## 2019-09-20 DIAGNOSIS — Z1231 Encounter for screening mammogram for malignant neoplasm of breast: Secondary | ICD-10-CM

## 2019-09-28 ENCOUNTER — Ambulatory Visit (INDEPENDENT_AMBULATORY_CARE_PROVIDER_SITE_OTHER): Payer: 59 | Admitting: Family Medicine

## 2019-09-28 ENCOUNTER — Other Ambulatory Visit: Payer: Self-pay

## 2019-09-28 ENCOUNTER — Encounter: Payer: Self-pay | Admitting: Family Medicine

## 2019-09-28 ENCOUNTER — Other Ambulatory Visit (HOSPITAL_COMMUNITY)
Admission: RE | Admit: 2019-09-28 | Discharge: 2019-09-28 | Disposition: A | Discharge status: Home / Self Care | Payer: 59 | Source: Ambulatory Visit | Attending: Family Medicine | Admitting: Family Medicine

## 2019-09-28 VITALS — BP 130/85 | HR 78 | Temp 96.9°F | Ht 64.0 in | Wt 179.3 lb

## 2019-09-28 DIAGNOSIS — E559 Vitamin D deficiency, unspecified: Secondary | ICD-10-CM | POA: Diagnosis not present

## 2019-09-28 DIAGNOSIS — R7303 Prediabetes: Secondary | ICD-10-CM | POA: Diagnosis not present

## 2019-09-28 DIAGNOSIS — Z01419 Encounter for gynecological examination (general) (routine) without abnormal findings: Secondary | ICD-10-CM | POA: Diagnosis not present

## 2019-09-28 DIAGNOSIS — Z0000 Encounter for general adult medical examination without abnormal findings: Secondary | ICD-10-CM

## 2019-09-28 DIAGNOSIS — D509 Iron deficiency anemia, unspecified: Secondary | ICD-10-CM | POA: Diagnosis not present

## 2019-09-28 DIAGNOSIS — Z803 Family history of malignant neoplasm of breast: Secondary | ICD-10-CM

## 2019-09-28 DIAGNOSIS — G43009 Migraine without aura, not intractable, without status migrainosus: Secondary | ICD-10-CM

## 2019-09-28 DIAGNOSIS — K219 Gastro-esophageal reflux disease without esophagitis: Secondary | ICD-10-CM

## 2019-09-28 DIAGNOSIS — E669 Obesity, unspecified: Secondary | ICD-10-CM | POA: Diagnosis not present

## 2019-09-28 LAB — COMPREHENSIVE METABOLIC PANEL
ALT: 14 U/L (ref 0–35)
AST: 22 U/L (ref 0–37)
Albumin: 4.2 g/dL (ref 3.5–5.2)
Alkaline Phosphatase: 51 U/L (ref 39–117)
BUN: 10 mg/dL (ref 6–23)
CO2: 27 mEq/L (ref 19–32)
Calcium: 9.6 mg/dL (ref 8.4–10.5)
Chloride: 101 mEq/L (ref 96–112)
Creatinine, Ser: 0.76 mg/dL (ref 0.40–1.20)
GFR: 104.16 mL/min (ref >60.00)
Glucose, Bld: 104 mg/dL — ABNORMAL HIGH (ref 70–99)
Potassium: 4.3 mEq/L (ref 3.5–5.1)
Sodium: 135 mEq/L (ref 135–145)
Total Bilirubin: 0.4 mg/dL (ref 0.2–1.2)
Total Protein: 7.3 g/dL (ref 6.0–8.3)

## 2019-09-28 LAB — LIPID PANEL
Cholesterol: 178 mg/dL (ref 0–200)
HDL: 55.4 mg/dL (ref >39.00)
LDL Cholesterol: 106 mg/dL — ABNORMAL HIGH (ref 0–99)
NonHDL: 122.55
Total CHOL/HDL Ratio: 3
Triglycerides: 85 mg/dL (ref 0.0–149.0)
VLDL: 17 mg/dL (ref 0.0–40.0)

## 2019-09-28 LAB — CBC WITH DIFFERENTIAL/PLATELET
Basophils Absolute: 0 10*3/uL (ref 0.0–0.1)
Basophils Relative: 0.5 % (ref 0.0–3.0)
Eosinophils Absolute: 0.1 10*3/uL (ref 0.0–0.7)
Eosinophils Relative: 0.7 % (ref 0.0–5.0)
HCT: 38.6 % (ref 36.0–46.0)
Hemoglobin: 12.6 g/dL (ref 12.0–15.0)
Lymphocytes Relative: 26 % (ref 12.0–46.0)
Lymphs Abs: 2.5 10*3/uL (ref 0.7–4.0)
MCHC: 32.8 g/dL (ref 30.0–36.0)
MCV: 85.2 fl (ref 78.0–100.0)
Monocytes Absolute: 0.7 10*3/uL (ref 0.1–1.0)
Monocytes Relative: 7.1 % (ref 3.0–12.0)
Neutro Abs: 6.4 10*3/uL (ref 1.4–7.7)
Neutrophils Relative %: 65.7 % (ref 43.0–77.0)
Platelets: 324 10*3/uL (ref 150.0–400.0)
RBC: 4.53 Mil/uL (ref 3.87–5.11)
RDW: 14 % (ref 11.5–15.5)
WBC: 9.8 10*3/uL (ref 4.0–10.5)

## 2019-09-28 LAB — FERRITIN: Ferritin: 9.6 ng/mL — ABNORMAL LOW (ref 10.0–291.0)

## 2019-09-28 LAB — TSH: TSH: 4.25 u[IU]/mL (ref 0.35–4.50)

## 2019-09-28 LAB — HEMOGLOBIN A1C: Hgb A1c MFr Bld: 5.9 % (ref 4.6–6.5)

## 2019-09-28 LAB — VITAMIN D 25 HYDROXY (VIT D DEFICIENCY, FRACTURES): VITD: 21.15 ng/mL — ABNORMAL LOW (ref 30.00–100.00)

## 2019-09-28 MED ORDER — OMEPRAZOLE 20 MG PO CPDR: 20.0000 mg | DELAYED_RELEASE_CAPSULE | Freq: Two times a day (BID) | ORAL | 11 refills | Status: DC

## 2019-09-28 MED ORDER — SUMATRIPTAN SUCCINATE 100 MG PO TABS: 100.0000 mg | ORAL_TABLET | Freq: Once | ORAL | 11 refills | Status: DC

## 2019-09-28 MED ORDER — TOPIRAMATE 25 MG PO TABS: 25.0000 mg | ORAL_TABLET | Freq: Two times a day (BID) | ORAL | 11 refills | Status: DC

## 2019-09-28 NOTE — Assessment & Plan Note (Signed)
Discussed how this problem influences overall health and the risks it imposes  Reviewed plan for weight loss with lower calorie diet (via better food choices and also portion control or program like weight watchers) and exercise building up to or more than 30 minutes 5 days per week including some aerobic activity    

## 2019-09-28 NOTE — Assessment & Plan Note (Signed)
Less frequent with topamax and better working hours Ashlee Perry stress

## 2019-09-28 NOTE — Assessment & Plan Note (Signed)
Well controlled if she takes her omeprazole

## 2019-09-28 NOTE — Assessment & Plan Note (Signed)
Reviewed health habits including diet and exercise and skin cancer prevention Reviewed appropriate screening tests for age  Also reviewed health mt list, fam hx and immunization status , as well as social and family history   See HPI Labs ordered Pap and gyn exam done  Mammogram planned (in setting of early breast cancer in the family)

## 2019-09-28 NOTE — Assessment & Plan Note (Signed)
Now taking iron around time of menses (during and shortly before)  Cbc and ferritin today

## 2019-09-28 NOTE — Assessment & Plan Note (Signed)
Not taking supplement reliably Level today

## 2019-09-28 NOTE — Assessment & Plan Note (Signed)
A1C today Has been mindful of diet disc imp of low glycemic diet and wt loss to prevent DM2

## 2019-09-28 NOTE — Patient Instructions (Addendum)
BP was better on the 2nd check  Take a look at the [facility] eating plan Try to do some extra exercise   Labs today    Take care of yourself   Keep wearing a mask  I think a covid vaccine is a good idea when they become available    Pap done today

## 2019-09-28 NOTE — Assessment & Plan Note (Signed)
Exam with pap  Small introitus but no abnormalities noted  Discussed heavy menses-taking iron around time of it now  Lab today  Denies need for contraception  Denies need for STD screen

## 2019-09-28 NOTE — Assessment & Plan Note (Signed)
Mother had breast cancer at 59 Pt has started annual mammograms Nl exam today  Mammogram is already scheduled for next mo Enc regular self exams

## 2019-09-28 NOTE — Progress Notes (Signed)
Subjective:    Patient ID: Ashlee Perry, female    DOB: August 19, 1986, 34 y.o.   MRN: [id number]   This visit occurred during the SARS-CoV-2 public health emergency.  Safety protocols were in place, including screening questions prior to the visit, additional usage of staff PPE, and extensive cleaning of exam room while observing appropriate contact time as indicated for disinfecting solutions.    HPI Here for health maintenance exam and to review chronic medical problems   Has been feeling pretty good   Her back pain from last year improved   Wt Readings from Last 3 Encounters:  09/28/19 179 lb 5 oz (81.3 kg)  10/25/18 184 lb 7 oz (83.7 kg)  09/20/18 186 lb 8 oz (84.6 kg)  lost some weight  Very active job (no time for extra exercise)  30.78 kg/m   Self care: Stays with aunt who bakes and cooks a lot- she has to be careful  Has cheat days (picks and chooses)  Eating less in general   Packs her lunch Does crave fast food-occasionally   Would consider exercise videos   Flu vaccine - declines (she gets sick from them)  Tdap 2/16  STD screening  Menses-these very /sometimes heavy  Pap 5/18 -normal   Mammogram 2/20  Has one schedule 10/27/19 Self breast exam -no lumps or changes  Mother had breast cancer at age 104  Still gets migraines now and then  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 when possible- this helps   FMLA will be due soon Does not expect having to change anything     BP Readings from Last 3 Encounters:  09/28/19 (!) 144/96  10/25/18 134/76  09/20/18 120/80  better on 2nd check BP: 130/85   Has HTN in family- parents    Pulse Readings from Last 3 Encounters:  09/28/19 78  10/25/18 76  09/20/18 75    Due for labs H/o low D and prediabetes   Watching carbs/sugar  Takes vit D -not every day   H/o iron def anemia Lab Results  Component Value Date   WBC 9.7 09/10/2018   HGB 10.9 (L)  09/10/2018   HCT 34.0 (L) 09/10/2018   MCV 76.9 (L) 09/10/2018   PLT 343.0 09/10/2018  she takes iron before and during her menses   Patient Active Problem List   Diagnosis Date Noted  . Low back pain 10/25/2018  . Iron deficiency anemia 09/20/2018  . Migraine headache without aura 09/20/2018  . Prediabetes 09/13/2018  . Obesity (BMI 30-39.9) 12/31/2016  . Breast cancer screening by mammogram 12/31/2016  . Encounter for routine gynecological examination 10/18/2014  . Screen for STD (sexually transmitted disease) 10/18/2014  . Routine general medical examination at a health care facility 10/01/2014  . Vitamin D deficiency 04/24/2014  . GERD (gastroesophageal reflux disease) 04/24/2014  . Family history of breast cancer in first degree relative 04/24/2014   Past Medical History:  Diagnosis Date  . History of gastroesophageal reflux (GERD)    Past Surgical History:  Procedure Laterality Date  . WISDOM TOOTH EXTRACTION  2006   Social History   Tobacco Use  . Smoking status: Never Smoker  . Smokeless tobacco: Never Used  Substance Use Topics  . Alcohol use: Yes    Comment: wine-occ  . Drug use: No   Family History  Problem Relation Age of Onset  . Arthritis Maternal Grandfather   .  Kidney failure Maternal Grandfather   . Ovarian cancer Paternal Grandmother   . Breast cancer Mother 20  . Hypertension Mother   . Prostate cancer Paternal Grandfather   . Hypertension Father   . Diabetes Maternal Grandmother    No Known Allergies Current Outpatient Medications on File Prior to Visit  Medication Sig Dispense Refill  . Multiple Vitamins-Minerals (ALIVE WOMENS GUMMY PO) Take 1 tablet by mouth daily.     No current facility-administered medications on file prior to visit.    Review of Systems  Constitutional: Positive for fatigue. Negative for activity change, appetite change, fever and unexpected weight change.  HENT: Negative for congestion, ear pain, rhinorrhea, sinus  pressure and sore throat.   Eyes: Negative for pain, redness and visual disturbance.  Respiratory: Negative for cough, shortness of breath and wheezing.   Cardiovascular: Negative for chest pain and palpitations.  Gastrointestinal: Negative for abdominal pain, blood in stool, constipation and diarrhea.       GERd is controlled if she takes her omeprazole  Endocrine: Negative for polydipsia and polyuria.  Genitourinary: Negative for dysuria, frequency and urgency.  Musculoskeletal: Negative for arthralgias, back pain and myalgias.  Skin: Negative for pallor and rash.  Allergic/Immunologic: Negative for environmental allergies.  Neurological: Positive for headaches. Negative for dizziness and syncope.  Hematological: Negative for adenopathy. Does not bruise/bleed easily.  Psychiatric/Behavioral: Negative for decreased concentration and dysphoric mood. The patient is not nervous/anxious.        Objective:   Physical Exam Constitutional:      General: She is not in acute distress.    Appearance: Normal appearance. She is well-developed. She is obese. She is not ill-appearing or diaphoretic.  HENT:     Head: Normocephalic and atraumatic.     Right Ear: Tympanic membrane, ear canal and external ear normal.     Left Ear: Tympanic membrane, ear canal and external ear normal.     Nose: Nose normal. No congestion.     Mouth/Throat:     Mouth: Mucous membranes are moist.     Pharynx: Oropharynx is clear. No posterior oropharyngeal erythema.  Eyes:     General: No scleral icterus.    Extraocular Movements: Extraocular movements intact.     Conjunctiva/sclera: Conjunctivae normal.     Pupils: Pupils are equal, round, and reactive to light.  Neck:     Thyroid: No thyromegaly.     Vascular: No carotid bruit or JVD.  Cardiovascular:     Rate and Rhythm: Normal rate and regular rhythm.     Pulses: Normal pulses.     Heart sounds: Normal heart sounds. No gallop.   Pulmonary:     Effort:  Pulmonary effort is normal. No respiratory distress.     Breath sounds: Normal breath sounds. No wheezing.     Comments: Good air exch Chest:     Chest wall: No tenderness.  Abdominal:     General: Bowel sounds are normal. There is no distension or abdominal bruit.     Palpations: Abdomen is soft. There is no mass.     Tenderness: There is no abdominal tenderness.     Hernia: No hernia is present.  Genitourinary:    Comments: Breast exam: No mass, nodules, thickening, tenderness, bulging, retraction, inflamation, nipple discharge or skin changes noted.  No axillary or clavicular LA.              Anus appears normal w/o hemorrhoids or masses     External genitalia :  nl appearance and hair distribution/no lesions  Small introitus noted     Urethral meatus : nl size, no lesions or prolapse     Urethra: no masses, tenderness or scarring    Bladder : no masses or tenderness     Vagina: nl general appearance, no discharge or  Lesions, no significant cystocele  or rectocele     Cervix: no lesions/ discharge or friability    Uterus: nl size, contour, position, and mobility (not fixed) , non tender    Adnexa : no masses, tenderness, enlargement or nodularity       Musculoskeletal:        General: No tenderness. Normal range of motion.     Cervical back: Normal range of motion and neck supple. No rigidity. No muscular tenderness.     Right lower leg: No edema.     Left lower leg: No edema.  Lymphadenopathy:     Cervical: No cervical adenopathy.  Skin:    General: Skin is warm and dry.     Coloration: Skin is not pale.     Findings: No erythema or rash.     Comments: Few skin tags Some lentigines on chest  Mild lateral color change to R great toe nail consistent with fungal infection  (yellow)  No features of ingrown nails or athelete's foot     Neurological:     Mental Status: She is alert. Mental status is at baseline.     Cranial Nerves: No cranial nerve  deficit.     Motor: No abnormal muscle tone.     Coordination: Coordination normal.     Gait: Gait normal.     Deep Tendon Reflexes: Reflexes are normal and symmetric. Reflexes normal.  Psychiatric:        Mood and Affect: Mood normal.        Cognition and Memory: Cognition and memory normal.           Assessment & Plan:   Problem List Items Addressed This Visit      Cardiovascular and Mediastinum   Migraine headache without aura    Less frequent with topamax and better working hours Charlyne Petrin stress      Relevant Medications   SUMAtriptan (IMITREX) 100 MG tablet   topiramate (TOPAMAX) 25 MG tablet     Digestive   GERD (gastroesophageal reflux disease)    Well controlled if she takes her omeprazole      Relevant Medications   omeprazole (PRILOSEC) 20 MG capsule     Other   Vitamin D deficiency    Not taking supplement reliably Level today      Relevant Orders   VITAMIN D 25 Hydroxy (Vit-D Deficiency, Fractures) (Completed)   Family history of breast cancer in first degree relative    Mother had breast cancer at 69 Pt has started annual mammograms Nl exam today  Mammogram is already scheduled for next mo Enc regular self exams      Routine general medical examination at a health care facility - Primary    Reviewed health habits including diet and exercise and skin cancer prevention Reviewed appropriate screening tests for age  Also reviewed health mt list, fam hx and immunization status , as well as social and family history   See HPI Labs ordered Pap and gyn exam done  Mammogram planned (in setting of early breast cancer in the family)       Relevant Orders   CBC with Differential/Platelet (Completed)   Comprehensive  metabolic panel (Completed)   Lipid panel (Completed)   TSH (Completed)   Encounter for routine gynecological examination    Exam with pap  Small introitus but no abnormalities noted  Discussed heavy menses-taking iron around time of it  now  Lab today  Denies need for contraception  Denies need for STD screen      Relevant Orders   Cytology - PAP([place])   Obesity (BMI 30-39.9)    Discussed how this problem influences overall health and the risks it imposes  Reviewed plan for weight loss with lower calorie diet (via better food choices and also portion control or program like weight watchers) and exercise building up to or more than 30 minutes 5 days per week including some aerobic activity         Prediabetes    A1C today Has been mindful of diet disc imp of low glycemic diet and wt loss to prevent DM2       Relevant Orders   Hemoglobin A1c (Completed)   Iron deficiency anemia    Now taking iron around time of menses (during and shortly before)  Cbc and ferritin today      Relevant Orders   CBC with Differential/Platelet (Completed)   Ferritin (Completed)

## 2019-09-29 ENCOUNTER — Encounter: Payer: Self-pay | Admitting: *Deleted

## 2019-09-30 LAB — CYTOLOGY - PAP
Comment: NEGATIVE
Diagnosis: NEGATIVE
High risk HPV: NEGATIVE

## 2019-10-27 ENCOUNTER — Other Ambulatory Visit: Payer: Self-pay

## 2019-10-27 ENCOUNTER — Ambulatory Visit
Admission: RE | Admit: 2019-10-27 | Discharge: 2019-10-27 | Disposition: A | Discharge status: Home / Self Care | Payer: 59 | Source: Ambulatory Visit | Attending: Family Medicine | Admitting: Family Medicine

## 2019-10-27 DIAGNOSIS — Z1231 Encounter for screening mammogram for malignant neoplasm of breast: Secondary | ICD-10-CM

## 2019-11-10 ENCOUNTER — Ambulatory Visit
Admission: EM | Admit: 2019-11-10 | Discharge: 2019-11-10 | Disposition: A | Discharge status: Home / Self Care | Payer: 59 | Attending: Emergency Medicine | Admitting: Emergency Medicine

## 2019-11-10 ENCOUNTER — Encounter: Payer: Self-pay | Admitting: Emergency Medicine

## 2019-11-10 ENCOUNTER — Other Ambulatory Visit: Payer: Self-pay

## 2019-11-10 ENCOUNTER — Telehealth: Payer: Self-pay | Admitting: Family Medicine

## 2019-11-10 DIAGNOSIS — J069 Acute upper respiratory infection, unspecified: Secondary | ICD-10-CM | POA: Diagnosis not present

## 2019-11-10 HISTORY — DX: Migraine, unspecified, not intractable, without status migrainosus: G43.909

## 2019-11-10 MED ORDER — FLUTICASONE PROPIONATE 50 MCG/ACT NA SUSP: 1.0000 | Freq: Every day | NASAL | 0 refills | Status: AC

## 2019-11-10 MED ORDER — IBUPROFEN 800 MG PO TABS: 800.0000 mg | ORAL_TABLET | Freq: Three times a day (TID) | ORAL | 0 refills | Status: AC | PRN

## 2019-11-10 MED ORDER — MUCINEX 600 MG PO TB12: 1200.0000 mg | ORAL_TABLET | Freq: Two times a day (BID) | ORAL | 0 refills | Status: DC | PRN

## 2019-11-10 NOTE — Telephone Encounter (Signed)
Pt called for an appointment. States she feels like she has a sinus infection. She has been to urgent care and received a Covid Test and a Flu test and both came back negative. She states she has taken OTC medications and nothing is helping. She is wanting a prescription for zpak. I advised her we do not have any appointments available today but I can schedule her for a virtual visit tomorrow to discuss this. She stated she needed to be seen today. After talking with Rena, I advised the patient that the best thing to do would be to go back to urgent care or I would be more than happy to set her up for a virtual visit tomorrow to discuss this. She declined and hung up.

## 2019-11-10 NOTE — ED Triage Notes (Signed)
Patient in today c/o sinus congestion/pressure, headache x 3 days. Patient has felt nauseous this morning. Patient has tried OTC Mucinex, Afrin, vitamin c and cough drops without relief of symptoms. Patient's rapid covid and flu tests were negative yesterday at [facility].

## 2019-11-10 NOTE — ED Provider Notes (Signed)
Roderic Palau    CSN: [id number] Arrival date & time: 11/10/19  1356      History   Chief Complaint Chief Complaint  Patient presents with  . Sinus Problem    HPI Ashlee Perry is a 34 y.o. female.   Patient presents with 3-day history of sinus congestion, sinus pressure, headache.  She also reports nausea this morning but no vomiting.  She denies fever, chills, sore throat, cough, shortness of breath, diarrhea, rash, or other symptoms.  Patient was seen at [facility] yesterday; had negative rapid COVID and flu.  Treatment attempted at home with OTC Mucinex, Afrin, vitamin C, cough drops.  The history is provided by the patient.    Past Medical History:  Diagnosis Date  . History of gastroesophageal reflux (GERD)   . Migraine     Patient Active Problem List   Diagnosis Date Noted  . Low back pain 10/25/2018  . Iron deficiency anemia 09/20/2018  . Migraine headache without aura 09/20/2018  . Prediabetes 09/13/2018  . Obesity (BMI 30-39.9) 12/31/2016  . Breast cancer screening by mammogram 12/31/2016  . Encounter for routine gynecological examination 10/18/2014  . Screen for STD (sexually transmitted disease) 10/18/2014  . Routine general medical examination at a health care facility 10/01/2014  . Vitamin D deficiency 04/24/2014  . GERD (gastroesophageal reflux disease) 04/24/2014  . Family history of breast cancer in first degree relative 04/24/2014    Past Surgical History:  Procedure Laterality Date  . WISDOM TOOTH EXTRACTION  2006    OB History   No obstetric history on file.      Home Medications    Prior to Admission medications   Medication Sig Start Date End Date Taking? Authorizing Provider  Multiple Vitamins-Minerals (ALIVE WOMENS GUMMY PO) Take 1 tablet by mouth daily.   Yes [provider]  omeprazole (PRILOSEC) 20 MG capsule Take 1 capsule (20 mg total) by mouth 2 (two) times daily. Needs physical appt. before future refills are  given 09/28/19  Yes Tower, Wynelle Fanny, MD  SUMAtriptan (IMITREX) 100 MG tablet Take 1 tablet (100 mg total) by mouth once for 1 dose. 09/28/19 11/10/19 Yes Tower, Wynelle Fanny, MD  topiramate (TOPAMAX) 25 MG tablet Take 1 tablet (25 mg total) by mouth 2 (two) times daily. 09/28/19  Yes Tower, Wynelle Fanny, MD  fluticasone (FLONASE) 50 MCG/ACT nasal spray Place 1 spray into both nostrils daily. 11/10/19   Sharion Balloon, NP  guaiFENesin (MUCINEX) 600 MG 12 hr tablet Take 2 tablets (1,200 mg total) by mouth 2 (two) times daily as needed. 11/10/19   Sharion Balloon, NP  ibuprofen (ADVIL) 800 MG tablet Take 1 tablet (800 mg total) by mouth every 8 (eight) hours as needed. 11/10/19   Sharion Balloon, NP    Family History Family History  Problem Relation Age of Onset  . Arthritis Maternal Grandfather   . Kidney failure Maternal Grandfather   . Ovarian cancer Paternal Grandmother   . Breast cancer Mother 69  . Hypertension Mother   . Prostate cancer Paternal Grandfather   . Hypertension Father   . Diabetes Maternal Grandmother   . Breast cancer Maternal Grandmother   . Breast cancer Maternal Aunt   . Breast cancer Maternal Aunt   . Breast cancer Maternal Aunt     Social History Social History   Tobacco Use  . Smoking status: Never Smoker  . Smokeless tobacco: Never Used  Substance Use Topics  . Alcohol use: Yes  Comment: wine-occ  . Drug use: Yes    Types: Marijuana    Comment: 1-2 times a month     Allergies   Patient has no known allergies.   Review of Systems Review of Systems  Constitutional: Negative for chills and fever.  HENT: Positive for congestion, postnasal drip and sinus pressure. Negative for ear pain and sore throat.   Eyes: Negative for pain and visual disturbance.  Respiratory: Negative for cough and shortness of breath.   Cardiovascular: Negative for chest pain and palpitations.  Gastrointestinal: Positive for nausea. Negative for abdominal pain, diarrhea and vomiting.   Genitourinary: Negative for dysuria and hematuria.  Musculoskeletal: Negative for arthralgias and back pain.  Skin: Negative for color change and rash.  Neurological: Positive for headaches. Negative for dizziness, seizures and syncope.  All other systems reviewed and are negative.    Physical Exam Triage Vital Signs ED Triage Vitals  Enc Vitals Group     BP 11/10/19 1403 126/90     Pulse Rate 11/10/19 1403 99     Resp 11/10/19 1403 18     Temp 11/10/19 1403 98.6 F (37 C)     Temp Source 11/10/19 1403 Oral     SpO2 11/10/19 1403 98 %     Weight 11/10/19 1404 175 lb (79.4 kg)     Height 11/10/19 1404 5\' 4"  (1.626 m)     Head Circumference --      Peak Flow --      Pain Score 11/10/19 1402 8     Pain Loc --      Pain Edu? --      Excl. in GC? --    No data found.  Updated Vital Signs BP 126/90 (BP Location: Left Arm)   Pulse 99   Temp 98.6 F (37 C) (Oral)   Resp 18   Ht 5\' 4"  (1.626 m)   Wt 175 lb (79.4 kg)   LMP 10/26/2019 (Exact Date)   SpO2 98%   BMI 30.04 kg/m   Visual Acuity Right Eye Distance:   Left Eye Distance:   Bilateral Distance:    Right Eye Near:   Left Eye Near:    Bilateral Near:     Physical Exam Vitals and nursing note reviewed.  Constitutional:      General: She is not in acute distress.    Appearance: She is well-developed.  HENT:     Head: Normocephalic and atraumatic.     Right Ear: Tympanic membrane normal.     Left Ear: Tympanic membrane normal.     Nose: Nose normal.     Mouth/Throat:     Mouth: Mucous membranes are moist.     Pharynx: Oropharynx is clear.  Eyes:     Conjunctiva/sclera: Conjunctivae normal.  Cardiovascular:     Rate and Rhythm: Normal rate and regular rhythm.     Heart sounds: No murmur.  Pulmonary:     Effort: Pulmonary effort is normal. No respiratory distress.     Breath sounds: Normal breath sounds.  Abdominal:     General: Bowel sounds are normal.     Palpations: Abdomen is soft.      Tenderness: There is no abdominal tenderness. There is no guarding or rebound.  Musculoskeletal:     Cervical back: Neck supple.  Skin:    General: Skin is warm and dry.     Findings: No rash.  Neurological:     General: No focal deficit present.  Mental Status: She is alert and oriented to person, place, and time.  Psychiatric:        Mood and Affect: Mood normal.        Behavior: Behavior normal.      UC Treatments / Results  Labs (all labs ordered are listed, but only abnormal results are displayed) Labs Reviewed  NOVEL CORONAVIRUS, NAA    EKG   Radiology No results found.  Procedures Procedures (including critical care time)  Medications Ordered in UC Medications - No data to display  Initial Impression / Assessment and Plan / UC Course  I have reviewed the triage vital signs and the nursing notes.  Pertinent labs & imaging results that were available during my care of the patient were reviewed by me and considered in my medical decision making (see chart for details).    Upper respiratory infection.  Treating with ibuprofen, Mucinex, Flonase.  Instructed patient to follow-up with her PCP if her symptoms are not improving.  PCR Covid test performed here.  Instructed patient to self quarantine until test result is back.  Instructed her to go to the ED if she has high fever not relieved by medication, shortness of breath, severe diarrhea, or other concerning symptoms.  Patient agrees to plan of care.     Final Clinical Impressions(s) / UC Diagnoses   Final diagnoses:  Upper respiratory tract infection, unspecified type     Discharge Instructions     Take the ibuprofen, Mucinex, and Flonase as directed.  Follow up with your primary care provider if your symptoms are not improving.    Your COVID test is pending.  You should self quarantine until your test result is back and is negative.    Go to the emergency department if you develop high fever, shortness  of breath, severe diarrhea, or other concerning symptoms.       ED Prescriptions    Medication Sig Dispense Auth. Provider   ibuprofen (ADVIL) 800 MG tablet Take 1 tablet (800 mg total) by mouth every 8 (eight) hours as needed. 21 tablet Mickie Bail, NP   guaiFENesin (MUCINEX) 600 MG 12 hr tablet Take 2 tablets (1,200 mg total) by mouth 2 (two) times daily as needed. 12 tablet Mickie Bail, NP   fluticasone (FLONASE) 50 MCG/ACT nasal spray Place 1 spray into both nostrils daily. 16 g Mickie Bail, NP     PDMP not reviewed this encounter.   Mickie Bail, NP 11/10/19 1430

## 2019-11-10 NOTE — Discharge Instructions (Addendum)
Take the ibuprofen, Mucinex, and Flonase as directed.  Follow up with your primary care provider if your symptoms are not improving.    Your COVID test is pending.  You should self quarantine until your test result is back and is negative.    Go to the emergency department if you develop high fever, shortness of breath, severe diarrhea, or other concerning symptoms.

## 2019-11-11 LAB — NOVEL CORONAVIRUS, NAA: SARS-CoV-2, NAA: NOT DETECTED

## 2019-12-24 DIAGNOSIS — J029 Acute pharyngitis, unspecified: Secondary | ICD-10-CM | POA: Diagnosis not present

## 2019-12-24 DIAGNOSIS — M542 Cervicalgia: Secondary | ICD-10-CM | POA: Diagnosis not present

## 2020-03-27 DIAGNOSIS — Z20822 Contact with and (suspected) exposure to covid-19: Secondary | ICD-10-CM | POA: Diagnosis not present

## 2020-03-27 DIAGNOSIS — Z03818 Encounter for observation for suspected exposure to other biological agents ruled out: Secondary | ICD-10-CM | POA: Diagnosis not present

## 2020-03-29 DIAGNOSIS — R509 Fever, unspecified: Secondary | ICD-10-CM | POA: Diagnosis not present

## 2020-03-29 DIAGNOSIS — Z20822 Contact with and (suspected) exposure to covid-19: Secondary | ICD-10-CM | POA: Diagnosis not present

## 2020-03-29 DIAGNOSIS — R05 Cough: Secondary | ICD-10-CM | POA: Diagnosis not present

## 2020-04-20 DIAGNOSIS — Z20822 Contact with and (suspected) exposure to covid-19: Secondary | ICD-10-CM | POA: Diagnosis not present

## 2020-08-08 ENCOUNTER — Telehealth: Payer: Self-pay | Admitting: Family Medicine

## 2020-08-08 NOTE — Telephone Encounter (Signed)
Called patient to inquire about the FMLA paperwork. Please get Ashlee Perry or ask if paperwork is the same from last time?  Thank you

## 2020-08-09 NOTE — Telephone Encounter (Signed)
Called patient and stated paperwork is the same. Filled and placed in PCP's inbox for review and signature.

## 2020-08-10 NOTE — Telephone Encounter (Signed)
Original copy placed in file up front for pick up. Patient has been notified paperwork is ready. Copy in blue folder on CMA desk.

## 2020-08-10 NOTE — Telephone Encounter (Signed)
Signed and in IN box 

## 2020-09-24 NOTE — Telephone Encounter (Signed)
Done and in IN box 

## 2020-09-24 NOTE — Telephone Encounter (Signed)
FMLA paperwork received. Employer wanting clarification on paperwork. Highlighted what is needed. Placed on PCP's inbox.

## 2020-09-25 NOTE — Telephone Encounter (Signed)
Notified patient paperwork ready for pick up as per DPR. LVM if needing to discuss more or provide fax number.

## 2020-10-02 ENCOUNTER — Other Ambulatory Visit: Payer: Self-pay | Admitting: Family Medicine

## 2020-10-02 DIAGNOSIS — I10 Essential (primary) hypertension: Secondary | ICD-10-CM | POA: Diagnosis not present

## 2020-10-02 DIAGNOSIS — S30860A Insect bite (nonvenomous) of lower back and pelvis, initial encounter: Secondary | ICD-10-CM | POA: Diagnosis not present

## 2020-10-02 DIAGNOSIS — L049 Acute lymphadenitis, unspecified: Secondary | ICD-10-CM | POA: Diagnosis not present

## 2020-10-02 DIAGNOSIS — L0390 Cellulitis, unspecified: Secondary | ICD-10-CM | POA: Diagnosis not present

## 2020-10-02 DIAGNOSIS — Z1231 Encounter for screening mammogram for malignant neoplasm of breast: Secondary | ICD-10-CM

## 2020-10-22 ENCOUNTER — Encounter: Payer: 59 | Admitting: Family Medicine

## 2020-10-24 ENCOUNTER — Other Ambulatory Visit: Payer: Self-pay

## 2020-10-24 ENCOUNTER — Ambulatory Visit (INDEPENDENT_AMBULATORY_CARE_PROVIDER_SITE_OTHER): Payer: Federal, State, Local not specified - PPO | Admitting: Family Medicine

## 2020-10-24 ENCOUNTER — Encounter: Payer: Self-pay | Admitting: Family Medicine

## 2020-10-24 VITALS — BP 136/92 | HR 77 | Temp 96.9°F | Ht 64.5 in | Wt 181.6 lb

## 2020-10-24 DIAGNOSIS — G43009 Migraine without aura, not intractable, without status migrainosus: Secondary | ICD-10-CM

## 2020-10-24 DIAGNOSIS — E669 Obesity, unspecified: Secondary | ICD-10-CM | POA: Diagnosis not present

## 2020-10-24 DIAGNOSIS — R7303 Prediabetes: Secondary | ICD-10-CM | POA: Diagnosis not present

## 2020-10-24 DIAGNOSIS — L659 Nonscarring hair loss, unspecified: Secondary | ICD-10-CM | POA: Insufficient documentation

## 2020-10-24 DIAGNOSIS — E559 Vitamin D deficiency, unspecified: Secondary | ICD-10-CM

## 2020-10-24 DIAGNOSIS — Z0000 Encounter for general adult medical examination without abnormal findings: Secondary | ICD-10-CM | POA: Diagnosis not present

## 2020-10-24 DIAGNOSIS — I10 Essential (primary) hypertension: Secondary | ICD-10-CM | POA: Diagnosis not present

## 2020-10-24 MED ORDER — SUMATRIPTAN SUCCINATE 100 MG PO TABS: 100.0000 mg | ORAL_TABLET | Freq: Once | ORAL | 11 refills | Status: AC

## 2020-10-24 MED ORDER — VERAPAMIL HCL ER 120 MG PO TBCR: 120.0000 mg | EXTENDED_RELEASE_TABLET | Freq: Every day | ORAL | 2 refills | Status: DC

## 2020-10-24 MED ORDER — TOPIRAMATE 25 MG PO TABS: 25.0000 mg | ORAL_TABLET | Freq: Two times a day (BID) | ORAL | 11 refills | Status: DC

## 2020-10-24 MED ORDER — OMEPRAZOLE 20 MG PO CPDR: 20.0000 mg | DELAYED_RELEASE_CAPSULE | Freq: Two times a day (BID) | ORAL | 11 refills | Status: DC

## 2020-10-24 NOTE — Assessment & Plan Note (Signed)
Level today  °Disc imp to bone and overall health °

## 2020-10-24 NOTE — Assessment & Plan Note (Signed)
Chronic migraine  FMLA for up to 2 days per week 8 hours allowed off if needed for migraine  Trial of verapamil for bp also -may help headaches Drinking more water and less soda now also

## 2020-10-24 NOTE — Progress Notes (Signed)
Subjective:    Patient ID: Ashlee Perry, female    DOB: 1986/05/19, 35 y.o.   MRN: [id number]  This visit occurred during the SARS-CoV-2 public health emergency.  Safety protocols were in place, including screening questions prior to the visit, additional usage of staff PPE, and extensive cleaning of exam room while observing appropriate contact time as indicated for disinfecting solutions.    HPI Here for health maintenance exam and to review chronic medical problems    Wt Readings from Last 3 Encounters:  10/24/20 181 lb 9 oz (82.4 kg)  11/10/19 175 lb (79.4 kg)  09/28/19 179 lb 5 oz (81.3 kg)   30.68 kg/m  Has been eating bad until her dx of HTN  Now eating better  -more clean Grilled chicken and fruit and veg   Started exercise also  Job is also physical     covid status -plans to get vaccinated  Flu shot-declines  Tdap 2/16   Mammogram 2/21  Has one scheduled for 11/06/20 Self breast exam -no lumps  Her mother had breast cancer at age 34   Pap 1/21 Neg with neg HPV Menses - heavier (gets headaches with them) and tired  Not active   Need to get labs for cbc Lab Results  Component Value Date   WBC 9.8 09/28/2019   HGB 12.6 09/28/2019   HCT 38.6 09/28/2019   MCV 85.2 09/28/2019   PLT 324.0 09/28/2019     HTN  ? If any symptoms   She does have headache  bp is elevated on first check  Was px lisinopril from UC   No cp or palpitations or headaches or edema  No side effects to medicines  BP Readings from Last 3 Encounters:  10/24/20 (!) 136/92  11/10/19 126/90  09/28/19 130/85     Pulse Readings from Last 3 Encounters:  10/24/20 77  11/10/19 99  09/28/19 78   Thinks she may have a little cough from ace and also hair loss so took every other day   Lab Results  Component Value Date   CREATININE 0.76 09/28/2019   BUN 10 09/28/2019   NA 135 09/28/2019   K 4.3 09/28/2019   CL 101 09/28/2019   CO2 27 09/28/2019  due for labs    Prediabetes Lab Results  Component Value Date   HGBA1C 5.9 09/28/2019   Due for labs Trying to avoid processed foods and sweets    H/o low D No h/o fractures  Patient Active Problem List   Diagnosis Date Noted  . Benign essential HTN 10/24/2020  . Hair loss 10/24/2020  . Low back pain 10/25/2018  . Iron deficiency anemia 09/20/2018  . Migraine headache without aura 09/20/2018  . Prediabetes 09/13/2018  . Obesity (BMI 30-39.9) 12/31/2016  . Breast cancer screening by mammogram 12/31/2016  . Encounter for routine gynecological examination 10/18/2014  . Screen for STD (sexually transmitted disease) 10/18/2014  . Routine general medical examination at a health care facility 10/01/2014  . Vitamin D deficiency 04/24/2014  . GERD (gastroesophageal reflux disease) 04/24/2014  . Family history of breast cancer in first degree relative 04/24/2014   Past Medical History:  Diagnosis Date  . History of gastroesophageal reflux (GERD)   . Migraine    Past Surgical History:  Procedure Laterality Date  . WISDOM TOOTH EXTRACTION  2006   Social History   Tobacco Use  . Smoking status: Never Smoker  . Smokeless tobacco: Never Used  Vaping Use  .  Vaping Use: Some days  . Substances: Nicotine, Flavoring  Substance Use Topics  . Alcohol use: Yes    Comment: wine-occ  . Drug use: Yes    Types: Marijuana    Comment: 1-2 times a month   Family History  Problem Relation Age of Onset  . Arthritis Maternal Grandfather   . Kidney failure Maternal Grandfather   . Ovarian cancer Paternal Grandmother   . Breast cancer Mother 57  . Hypertension Mother   . Prostate cancer Paternal Grandfather   . Hypertension Father   . Diabetes Maternal Grandmother   . Breast cancer Maternal Grandmother   . Breast cancer Maternal Aunt   . Breast cancer Maternal Aunt   . Breast cancer Maternal Aunt    Allergies  Allergen Reactions  . Lisinopril     Hair loss and cough    Current Outpatient  Medications on File Prior to Visit  Medication Sig Dispense Refill  . fluticasone (FLONASE) 50 MCG/ACT nasal spray Place 1 spray into both nostrils daily. 16 g 0  . guaiFENesin (MUCINEX) 600 MG 12 hr tablet Take 2 tablets (1,200 mg total) by mouth 2 (two) times daily as needed. 12 tablet 0  . ibuprofen (ADVIL) 800 MG tablet Take 1 tablet (800 mg total) by mouth every 8 (eight) hours as needed. 21 tablet 0  . Multiple Vitamins-Minerals (ALIVE WOMENS GUMMY PO) Take 1 tablet by mouth daily.     No current facility-administered medications on file prior to visit.     Review of Systems  Constitutional: Positive for fatigue. Negative for activity change, appetite change, fever and unexpected weight change.  HENT: Negative for congestion, ear pain, rhinorrhea, sinus pressure and sore throat.   Eyes: Negative for pain, redness and visual disturbance.  Respiratory: Negative for cough, shortness of breath and wheezing.   Cardiovascular: Negative for chest pain and palpitations.  Gastrointestinal: Negative for abdominal pain, blood in stool, constipation and diarrhea.  Endocrine: Negative for polydipsia and polyuria.  Genitourinary: Negative for dysuria, frequency and urgency.  Musculoskeletal: Negative for arthralgias, back pain and myalgias.  Skin: Negative for pallor and rash.  Allergic/Immunologic: Negative for environmental allergies.  Neurological: Positive for headaches. Negative for dizziness, syncope, light-headedness and numbness.  Hematological: Negative for adenopathy. Does not bruise/bleed easily.  Psychiatric/Behavioral: Negative for decreased concentration and dysphoric mood. The patient is not nervous/anxious.        Objective:   Physical Exam Constitutional:      General: She is not in acute distress.    Appearance: Normal appearance. She is well-developed. She is obese. She is not ill-appearing or diaphoretic.  HENT:     Head: Normocephalic and atraumatic.     Right Ear:  Tympanic membrane, ear canal and external ear normal.     Left Ear: Tympanic membrane, ear canal and external ear normal.     Nose: Nose normal. No congestion.     Mouth/Throat:     Mouth: Mucous membranes are moist.     Pharynx: Oropharynx is clear. No posterior oropharyngeal erythema.  Eyes:     General: No scleral icterus.    Extraocular Movements: Extraocular movements intact.     Conjunctiva/sclera: Conjunctivae normal.     Pupils: Pupils are equal, round, and reactive to light.  Neck:     Thyroid: No thyromegaly.     Vascular: No carotid bruit or JVD.  Cardiovascular:     Rate and Rhythm: Normal rate and regular rhythm.     Pulses: Normal  pulses.     Heart sounds: Normal heart sounds. No gallop.   Pulmonary:     Effort: Pulmonary effort is normal. No respiratory distress.     Breath sounds: Normal breath sounds. No wheezing.     Comments: Good air exch Chest:     Chest wall: No tenderness.  Abdominal:     General: Bowel sounds are normal. There is no distension or abdominal bruit.     Palpations: Abdomen is soft. There is no mass.     Tenderness: There is no abdominal tenderness.     Hernia: No hernia is present.  Genitourinary:    Comments: Breast exam: No mass, nodules, thickening, tenderness, bulging, retraction, inflamation, nipple discharge or skin changes noted.  No axillary or clavicular LA.     Musculoskeletal:        General: No tenderness. Normal range of motion.     Cervical back: Normal range of motion and neck supple. No rigidity. No muscular tenderness.     Right lower leg: No edema.     Left lower leg: No edema.  Lymphadenopathy:     Cervical: No cervical adenopathy.  Skin:    General: Skin is warm and dry.     Coloration: Skin is not pale.     Findings: No erythema or rash.     Comments: Skin tags on neck  Neurological:     Mental Status: She is alert. Mental status is at baseline.     Cranial Nerves: No cranial nerve deficit.     Motor: No abnormal  muscle tone.     Coordination: Coordination normal.     Gait: Gait normal.     Deep Tendon Reflexes: Reflexes are normal and symmetric. Reflexes normal.  Psychiatric:        Mood and Affect: Mood normal.        Cognition and Memory: Cognition and memory normal.           Assessment & Plan:   Problem List Items Addressed This Visit      Cardiovascular and Mediastinum   Migraine headache without aura    Chronic migraine  FMLA for up to 2 days per week 8 hours allowed off if needed for migraine  Trial of verapamil for bp also -may help headaches Drinking more water and less soda now also      Relevant Medications   verapamil (CALAN-SR) 120 MG CR tablet   topiramate (TOPAMAX) 25 MG tablet   SUMAtriptan (IMITREX) 100 MG tablet   Benign essential HTN    Pt experiencing hair loss and cough/chest pressure with lisinopril Will stop that  In light of migraines-suggest trial of low dose verapamil SR 120 mg daily  F/u 4-6 wk  Disc lifestyle changes -commended on change so far Given handout re: DASH eating       Relevant Medications   verapamil (CALAN-SR) 120 MG CR tablet   Other Relevant Orders   CBC with Differential/Platelet   Comprehensive metabolic panel   TSH   Lipid panel     Other   Vitamin D deficiency    Level today  Disc imp to bone and overall health      Relevant Orders   VITAMIN D 25 Hydroxy (Vit-D Deficiency, Fractures)   Routine general medical examination at a health care facility - Primary    Reviewed health habits including diet and exercise and skin cancer prevention Reviewed appropriate screening tests for age  Also reviewed health mt list, fam  hx and immunization status , as well as social and family history   See HPI Labs ordered  Better health habits -commended  Planning covid vaccination but declines flu shot  Mammogram planned (for strong fam hx) Pap utd and declines need for std testing or contraception  Beginning tx for HTN       Obesity (BMI 30-39.9)    Discussed how this problem influences overall health and the risks it imposes  Reviewed plan for weight loss with lower calorie diet (via better food choices and also portion control or program like weight watchers) and exercise building up to or more than 30 minutes 5 days per week including some aerobic activity   Making some changes now-commended      Prediabetes    A1C today  disc imp of low glycemic diet and wt loss to prevent DM2       Relevant Orders   Hemoglobin A1c   Hair loss    Suspect due to lisinopril  D/c this and monitor Also watch cbc with h/o of iron def/heavy menses

## 2020-10-24 NOTE — Assessment & Plan Note (Signed)
Reviewed health habits including diet and exercise and skin cancer prevention Reviewed appropriate screening tests for age  Also reviewed health mt list, fam hx and immunization status , as well as social and family history   See HPI Labs ordered  Better health habits -commended  Planning covid vaccination but declines flu shot  Mammogram planned (for strong fam hx) Pap utd and declines need for std testing or contraception  Beginning tx for HTN

## 2020-10-24 NOTE — Assessment & Plan Note (Signed)
Pt experiencing hair loss and cough/chest pressure with lisinopril Will stop that  In light of migraines-suggest trial of low dose verapamil SR 120 mg daily  F/u 4-6 wk  Disc lifestyle changes -commended on change so far Given handout re: DASH eating

## 2020-10-24 NOTE — Assessment & Plan Note (Signed)
A1C today  disc imp of low glycemic diet and wt loss to prevent DM2  

## 2020-10-24 NOTE — Patient Instructions (Addendum)
Stop the lisinopril  Let's try verapamil instead 120 mg daily  If any side effects or problems let us know  It may prevent headaches also   Keep working on diet and exercise   Get your mammogram as planned  Take care of yourself

## 2020-10-24 NOTE — Assessment & Plan Note (Signed)
Suspect due to lisinopril  D/c this and monitor Also watch cbc with h/o of iron def/heavy menses

## 2020-10-24 NOTE — Assessment & Plan Note (Signed)
Discussed how this problem influences overall health and the risks it imposes  Reviewed plan for weight loss with lower calorie diet (via better food choices and also portion control or program like weight watchers) and exercise building up to or more than 30 minutes 5 days per week including some aerobic activity   Making some changes now-commended

## 2020-10-25 LAB — CBC WITH DIFFERENTIAL/PLATELET
Basophils Absolute: 0.1 10*3/uL (ref 0.0–0.1)
Basophils Relative: 0.9 % (ref 0.0–3.0)
Eosinophils Absolute: 0.1 10*3/uL (ref 0.0–0.7)
Eosinophils Relative: 1 % (ref 0.0–5.0)
HCT: 38.4 % (ref 36.0–46.0)
Hemoglobin: 12.7 g/dL (ref 12.0–15.0)
Lymphocytes Relative: 33.1 % (ref 12.0–46.0)
Lymphs Abs: 2.7 10*3/uL (ref 0.7–4.0)
MCHC: 33 g/dL (ref 30.0–36.0)
MCV: 84.9 fl (ref 78.0–100.0)
Monocytes Absolute: 0.6 10*3/uL (ref 0.1–1.0)
Monocytes Relative: 7.1 % (ref 3.0–12.0)
Neutro Abs: 4.7 10*3/uL (ref 1.4–7.7)
Neutrophils Relative %: 57.9 % (ref 43.0–77.0)
Platelets: 321 10*3/uL (ref 150.0–400.0)
RBC: 4.52 Mil/uL (ref 3.87–5.11)
RDW: 13.7 % (ref 11.5–15.5)
WBC: 8.2 10*3/uL (ref 4.0–10.5)

## 2020-10-25 LAB — COMPREHENSIVE METABOLIC PANEL
ALT: 10 U/L (ref 0–35)
AST: 19 U/L (ref 0–37)
Albumin: 4.3 g/dL (ref 3.5–5.2)
Alkaline Phosphatase: 48 U/L (ref 39–117)
BUN: 12 mg/dL (ref 6–23)
CO2: 29 mEq/L (ref 19–32)
Calcium: 9.8 mg/dL (ref 8.4–10.5)
Chloride: 103 mEq/L (ref 96–112)
Creatinine, Ser: 0.82 mg/dL (ref 0.40–1.20)
GFR: 91.56 mL/min (ref >60.00)
Glucose, Bld: 87 mg/dL (ref 70–99)
Potassium: 4.6 mEq/L (ref 3.5–5.1)
Sodium: 138 mEq/L (ref 135–145)
Total Bilirubin: 0.7 mg/dL (ref 0.2–1.2)
Total Protein: 7.3 g/dL (ref 6.0–8.3)

## 2020-10-25 LAB — LIPID PANEL
Cholesterol: 191 mg/dL (ref 0–200)
HDL: 56.3 mg/dL (ref >39.00)
LDL Cholesterol: 107 mg/dL — ABNORMAL HIGH (ref 0–99)
NonHDL: 134.69
Total CHOL/HDL Ratio: 3
Triglycerides: 140 mg/dL (ref 0.0–149.0)
VLDL: 28 mg/dL (ref 0.0–40.0)

## 2020-10-25 LAB — VITAMIN D 25 HYDROXY (VIT D DEFICIENCY, FRACTURES): VITD: 31.95 ng/mL (ref 30.00–100.00)

## 2020-10-25 LAB — TSH: TSH: 2.43 u[IU]/mL (ref 0.35–4.50)

## 2020-10-25 LAB — HEMOGLOBIN A1C: Hgb A1c MFr Bld: 5.8 % (ref 4.6–6.5)

## 2020-10-30 ENCOUNTER — Telehealth: Payer: Self-pay | Admitting: Family Medicine

## 2020-10-30 MED ORDER — AMLODIPINE BESYLATE 5 MG PO TABS: 5.0000 mg | ORAL_TABLET | Freq: Every day | ORAL | 3 refills | Status: DC

## 2020-10-30 NOTE — Telephone Encounter (Signed)
Patient is calling in about her medication. She states that it is making her constipated . She is also worried about blood clots with it. Please call the patient to discuss.  Verapamil  EM

## 2020-10-30 NOTE — Telephone Encounter (Signed)
Pt notified of Dr. Tower's comments and new Rx sent to pharmacy  

## 2020-10-30 NOTE — Addendum Note (Signed)
Addended by: Roxy Manns A on: 10/30/2020 03:25 PM   Modules accepted: Orders

## 2020-10-30 NOTE — Telephone Encounter (Signed)
Please change it to amlodipine if agreeable  I pended to pharmacy of choice   Alert me if side effects or problems

## 2020-10-30 NOTE — Addendum Note (Signed)
Addended by: Shon Millet on: 10/30/2020 05:06 PM   Modules accepted: Orders

## 2020-11-02 ENCOUNTER — Telehealth: Payer: Self-pay | Admitting: Family Medicine

## 2020-11-02 MED ORDER — HYDROCHLOROTHIAZIDE 25 MG PO TABS: 25.0000 mg | ORAL_TABLET | Freq: Every day | ORAL | 3 refills | Status: DC

## 2020-11-02 NOTE — Addendum Note (Signed)
Addended by: Shon Millet on: 11/02/2020 04:41 PM   Modules accepted: Orders

## 2020-11-02 NOTE — Telephone Encounter (Signed)
Please change to hctz 25 mg 1 po qd #30 3 refills Needs bmet in 1-2 weeks  Thanks  Add amlodipine to med allergy list

## 2020-11-02 NOTE — Telephone Encounter (Signed)
Patient states that she is having some serious side effects from the medication Amlodipine  Severe migraine , Sensitivity to light and noise, Her eyes are aching. Please advise. EM

## 2020-11-02 NOTE — Telephone Encounter (Signed)
Left VM letting pt know Dr. Royden Purl comments,  Will try and f/u back up with pt next week to make sure lab appt was scheduled  Rx sent to pharmacy

## 2020-11-06 ENCOUNTER — Other Ambulatory Visit: Payer: Self-pay

## 2020-11-06 ENCOUNTER — Ambulatory Visit
Admission: RE | Admit: 2020-11-06 | Discharge: 2020-11-06 | Disposition: A | Discharge status: Home / Self Care | Payer: Federal, State, Local not specified - PPO | Source: Ambulatory Visit | Attending: Family Medicine | Admitting: Family Medicine

## 2020-11-06 DIAGNOSIS — Z1231 Encounter for screening mammogram for malignant neoplasm of breast: Secondary | ICD-10-CM

## 2020-11-08 MED ORDER — BISOPROLOL-HYDROCHLOROTHIAZIDE 5-6.25 MG PO TABS: 1.0000 | ORAL_TABLET | Freq: Every day | ORAL | 3 refills | Status: DC

## 2020-11-08 NOTE — Telephone Encounter (Signed)
I sent it  Please watch for any side effects If bp is below 90/50 or dizzy hold it and call  Also if pulse is below 50  Follow up as planned

## 2020-11-08 NOTE — Telephone Encounter (Addendum)
Pt called and she did not pick up the HCTZ but for the last 4 days has been taking pts mom's med Bisoprolol 5-6.25 mg taking one tab by mouth daily. On 11/07/20 BP was 122/87 P ?Marland Kitchen Pt took BP now; BP is 99/79 P is 67. Pt said she has just woken up  Pt called so late due to working 3rd shift. Pt wanted to see if could get rx for Bisoprolol 5 - 6.25 mg taking one tab po daily. Please see 10/24/20 office note and phone note on 10/30/20. Pt last took the Bisoprolol 5-6.25 mg on 11/07/20. Pt retook BP and now BP is 114/82 P 64. Pt said she has no CP,SOB,H/aDizziness or vision changes; pt said she feels good. Pt will wait for cb on 11/07/20. UC & ED precautions given and pt voiced understanding. [facility]. Sending note to DR [employer] who ois out of office and Allayne Gitelman NP.

## 2020-11-08 NOTE — Addendum Note (Signed)
Addended by: Roxy Manns A on: 11/08/2020 05:54 PM   Modules accepted: Orders

## 2020-11-09 NOTE — Telephone Encounter (Signed)
LMTCB

## 2020-11-09 NOTE — Telephone Encounter (Signed)
Spoke with patient about rx and message from Dr. Milinda Antis. Patient verbalized understanding and will call if has any problems. Will f/u as scheduled.

## 2020-11-23 ENCOUNTER — Ambulatory Visit: Payer: Federal, State, Local not specified - PPO | Admitting: Family Medicine

## 2020-12-10 ENCOUNTER — Other Ambulatory Visit: Payer: Self-pay

## 2020-12-10 ENCOUNTER — Encounter: Payer: Self-pay | Admitting: Family Medicine

## 2020-12-10 ENCOUNTER — Ambulatory Visit (INDEPENDENT_AMBULATORY_CARE_PROVIDER_SITE_OTHER): Payer: Federal, State, Local not specified - PPO | Admitting: Family Medicine

## 2020-12-10 VITALS — BP 128/90 | HR 79 | Temp 96.9°F | Ht 64.5 in | Wt 185.0 lb

## 2020-12-10 DIAGNOSIS — R0789 Other chest pain: Secondary | ICD-10-CM | POA: Insufficient documentation

## 2020-12-10 DIAGNOSIS — I10 Essential (primary) hypertension: Secondary | ICD-10-CM

## 2020-12-10 MED ORDER — CHLORTHALIDONE 25 MG PO TABS: 12.5000 mg | ORAL_TABLET | Freq: Every day | ORAL | 1 refills | Status: DC

## 2020-12-10 NOTE — Patient Instructions (Addendum)
Try to avoid processed foods Try to get most of your carbohydrates from produce (with the exception of white potatoes)  Eat less bread/pasta/rice/snack foods/cereals/sweets and other items from the middle of the grocery store (processed carbs)  Gradually add the exercise as you can    Start chlorthalidone 12.5 mg (one half of a 25 mg tablet) daily in the am  If any problems or intolerable side effects please let me know  Follow up in 2-4 weeks for a visit and labs

## 2020-12-10 NOTE — Progress Notes (Signed)
Subjective:    Patient ID: Ashlee Perry, female    DOB: 04/05/1986, 35 y.o.   MRN: [id number]  This visit occurred during the SARS-CoV-2 public health emergency.  Safety protocols were in place, including screening questions prior to the visit, additional usage of staff PPE, and extensive cleaning of exam room while observing appropriate contact time as indicated for disinfecting solutions.    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  She is very tired   BP Readings from Last 3 Encounters:  12/10/20 128/90  10/24/20 (!) 136/92  11/10/19 126/90   At home 111/88 first thing in the am    Pulse Readings from Last 3 Encounters:  12/10/20 79  10/24/20 77  11/10/19 99   Was previously on lisinopril and that caused hair loss and cough  Stopped it  Amlodipine caused migraine  We then tried verapamil xr 120 mg daily and it mader her constipated  Stopped ziac because she had some chest pressure  (had tried bisoprolol 5-6.25 mg daily  (took her mom's medicine to start and then px it)  We had planned before then to px hctz 25 mg daily   EKG today shows NSR with rate of 74 and no acute changes   Eating better Salmon and chicken  Water and very little coffee No soda  Much more discipline re: lifestyle  Stopped junk food  Needs to walk for-needs to find time for it   Lab Results  Component Value Date   CHOL 191 10/24/2020   HDL 56.30 10/24/2020   LDLCALC 107 (H) 10/24/2020   TRIG 140.0 10/24/2020   CHOLHDL 3 10/24/2020   HTN in both parents  GF  And aunt with CAD  Patient Active Problem List   Diagnosis Date Noted  . Chest pressure 12/10/2020  . Benign essential HTN 10/24/2020  . Hair loss 10/24/2020  . Low back pain 10/25/2018   . Iron deficiency anemia 09/20/2018  . Migraine headache without aura 09/20/2018  . Prediabetes 09/13/2018  . Obesity (BMI 30-39.9) 12/31/2016  . Breast cancer screening by mammogram 12/31/2016  . Encounter for routine gynecological examination 10/18/2014  . Screen for STD (sexually transmitted disease) 10/18/2014  . Routine general medical examination at a health care facility 10/01/2014  . Vitamin D deficiency 04/24/2014  . GERD (gastroesophageal reflux disease) 04/24/2014  . Family history of breast cancer in first degree relative 04/24/2014   Past Medical History:  Diagnosis Date  . History of gastroesophageal reflux (GERD)   . Migraine    Past Surgical History:  Procedure Laterality Date  . WISDOM TOOTH EXTRACTION  2006   Social History   Tobacco Use  . Smoking status: Never Smoker  . Smokeless tobacco: Never Used  Vaping Use  . Vaping Use: Some days  . Substances: Nicotine, Flavoring  Substance Use Topics  . Alcohol use: Yes    Comment: wine-occ  . Drug use: Yes    Types: Marijuana    Comment: 1-2 times a month   Family History  Problem Relation Age of Onset  . Arthritis Maternal Grandfather   . Kidney failure Maternal Grandfather   . Ovarian cancer Paternal Grandmother   .  Breast cancer Mother 56  . Hypertension Mother   . Prostate cancer Paternal Grandfather   . Hypertension Father   . Diabetes Maternal Grandmother   . Breast cancer Maternal Grandmother   . Breast cancer Maternal Aunt   . Breast cancer Maternal Aunt   . Breast cancer Maternal Aunt    Allergies  Allergen Reactions  . Amlodipine Other (See Comments)    Migraine, light/noise sensitivity, eye/facial pain  . Bisoprolol     Chest pressure   . Calan Sr [Verapamil]     Constipation    . Lisinopril     Hair loss and cough    Current Outpatient Medications on File Prior to Visit  Medication Sig Dispense Refill  . fluticasone (FLONASE) 50 MCG/ACT nasal spray Place 1 spray into both  nostrils daily. 16 g 0  . guaiFENesin (MUCINEX) 600 MG 12 hr tablet Take 2 tablets (1,200 mg total) by mouth 2 (two) times daily as needed. 12 tablet 0  . ibuprofen (ADVIL) 800 MG tablet Take 1 tablet (800 mg total) by mouth every 8 (eight) hours as needed. 21 tablet 0  . Multiple Vitamins-Minerals (ALIVE WOMENS GUMMY PO) Take 1 tablet by mouth daily.    Marland Kitchen omeprazole (PRILOSEC) 20 MG capsule Take 1 capsule (20 mg total) by mouth 2 (two) times daily. Needs physical appt. before future refills are given 60 capsule 11  . SUMAtriptan (IMITREX) 100 MG tablet Take 1 tablet (100 mg total) by mouth once for 1 dose. 10 tablet 11  . topiramate (TOPAMAX) 25 MG tablet Take 1 tablet (25 mg total) by mouth 2 (two) times daily. 60 tablet 11   No current facility-administered medications on file prior to visit.     Review of Systems  Constitutional: Negative for activity change, appetite change, fatigue, fever and unexpected weight change.  HENT: Negative for congestion, ear pain, rhinorrhea, sinus pressure and sore throat.   Eyes: Negative for pain, redness and visual disturbance.  Respiratory: Negative for cough, shortness of breath and wheezing.   Cardiovascular: Negative for chest pain and palpitations.       Chest pressure is improved  Gastrointestinal: Negative for abdominal pain, blood in stool, constipation and diarrhea.  Endocrine: Negative for polydipsia and polyuria.  Genitourinary: Negative for dysuria, frequency and urgency.  Musculoskeletal: Negative for arthralgias, back pain and myalgias.  Skin: Negative for pallor and rash.  Allergic/Immunologic: Negative for environmental allergies.  Neurological: Negative for dizziness, syncope and headaches.  Hematological: Negative for adenopathy. Does not bruise/bleed easily.  Psychiatric/Behavioral: Negative for decreased concentration and dysphoric mood. The patient is nervous/anxious.        Very stressed       Objective:   Physical  Exam Constitutional:      General: She is not in acute distress.    Appearance: Normal appearance. She is well-developed. She is obese.  HENT:     Head: Normocephalic and atraumatic.  Eyes:     Conjunctiva/sclera: Conjunctivae normal.     Pupils: Pupils are equal, round, and reactive to light.  Neck:     Thyroid: No thyromegaly.     Vascular: No carotid bruit or JVD.  Cardiovascular:     Rate and Rhythm: Normal rate and regular rhythm.     Heart sounds: Normal heart sounds. No gallop.   Pulmonary:     Effort: Pulmonary effort is normal. No respiratory distress.     Breath sounds: Normal breath sounds. No wheezing or rales.  Abdominal:  General: Bowel sounds are normal. There is no distension or abdominal bruit.     Palpations: Abdomen is soft. There is no mass.     Tenderness: There is no abdominal tenderness.  Musculoskeletal:     Cervical back: Normal range of motion and neck supple.     Right lower leg: No edema.     Left lower leg: No edema.  Lymphadenopathy:     Cervical: No cervical adenopathy.  Skin:    General: Skin is warm and dry.     Findings: No rash.  Neurological:     Mental Status: She is alert.     Coordination: Coordination normal.     Deep Tendon Reflexes: Reflexes are normal and symmetric. Reflexes normal.  Psychiatric:        Mood and Affect: Mood is anxious.        Cognition and Memory: Cognition and memory normal.     Comments: Mildly anxious            Assessment & Plan:   Problem List Items Addressed This Visit      Cardiovascular and Mediastinum   Benign essential HTN - Primary    With strong fam hx of HTN Reassuring EKG today  Has been intolerant to lisinopril, ziac, amlodipine and verapamil sr so for  Will try chlorthalidone 12.5 mg daily (can titrate to 25 mg later if tolerated well) Enc to continue great lifestyle change  F/u 2-4 weeks for visit and labs       Relevant Medications   chlorthalidone (HYGROTON) 25 MG tablet    Other Relevant Orders   EKG 12-Lead (Completed)     Other   Chest pressure    Caused by ziac  Now improved Reassuring nl EKG Will continue to monitor        Relevant Orders   EKG 12-Lead (Completed)

## 2020-12-10 NOTE — Assessment & Plan Note (Signed)
With strong fam hx of HTN Reassuring EKG today  Has been intolerant to lisinopril, ziac, amlodipine and verapamil sr so for  Will try chlorthalidone 12.5 mg daily (can titrate to 25 mg later if tolerated well) Enc to continue great lifestyle change  F/u 2-4 weeks for visit and labs

## 2020-12-10 NOTE — Assessment & Plan Note (Signed)
Caused by ziac  Now improved Reassuring nl EKG Will continue to monitor

## 2020-12-24 ENCOUNTER — Encounter: Payer: Self-pay | Admitting: Family Medicine

## 2020-12-24 ENCOUNTER — Other Ambulatory Visit: Payer: Self-pay

## 2020-12-24 ENCOUNTER — Ambulatory Visit (INDEPENDENT_AMBULATORY_CARE_PROVIDER_SITE_OTHER): Payer: Federal, State, Local not specified - PPO | Admitting: Family Medicine

## 2020-12-24 VITALS — BP 118/70 | HR 88 | Temp 96.9°F | Ht 64.5 in | Wt 185.2 lb

## 2020-12-24 DIAGNOSIS — I10 Essential (primary) hypertension: Secondary | ICD-10-CM | POA: Diagnosis not present

## 2020-12-24 DIAGNOSIS — E669 Obesity, unspecified: Secondary | ICD-10-CM

## 2020-12-24 NOTE — Patient Instructions (Addendum)
Continue chlorthalidone   Keep working on healthy lifestyle habits  Avoid excess sodium and processed foods   Lab today

## 2020-12-24 NOTE — Progress Notes (Signed)
Subjective:    Patient ID: Ashlee Perry, female    DOB: 1985-12-23, 35 y.o.   MRN: [id number]  This visit occurred during the SARS-CoV-2 public health emergency.  Safety protocols were in place, including screening questions prior to the visit, additional usage of staff PPE, and extensive cleaning of exam room while observing appropriate contact time as indicated for disinfecting solutions.    HPI Pt presents for f/u of HTN   Wt Readings from Last 3 Encounters:  12/24/20 185 lb 4 oz (84 kg)  12/10/20 185 lb (83.9 kg)  10/24/20 181 lb 9 oz (82.4 kg)   31.31 kg/m  HTN  bp is stable today  No cp or palpitations or headaches or edema  No side effects to medicines  BP Readings from Last 3 Encounters:  12/24/20 118/70  12/10/20 128/90  10/24/20 (!) 136/92     Pulse Readings from Last 3 Encounters:  12/24/20 88  12/10/20 79  10/24/20 77   Last visit noted lisinopril and amlodipine and verapamil and ziac caused side eff  She currently takes chlorthalidone 12.5 mg daily  BP is better   Feeling better overall  Drinking more water  Stress level is a little better - getting time to take care of herself    Patient Active Problem List   Diagnosis Date Noted  . Chest pressure 12/10/2020  . Benign essential HTN 10/24/2020  . Hair loss 10/24/2020  . Low back pain 10/25/2018  . Iron deficiency anemia 09/20/2018  . Migraine headache without aura 09/20/2018  . Prediabetes 09/13/2018  . Obesity (BMI 30-39.9) 12/31/2016  . Breast cancer screening by mammogram 12/31/2016  . Encounter for routine gynecological examination 10/18/2014  . Screen for STD (sexually transmitted disease) 10/18/2014  . Routine general medical examination at a health care facility 10/01/2014  . Vitamin D deficiency 04/24/2014  . GERD (gastroesophageal reflux disease) 04/24/2014  . Family history of breast cancer in first degree relative 04/24/2014   Past Medical History:  Diagnosis Date  . History  of gastroesophageal reflux (GERD)   . Migraine    Past Surgical History:  Procedure Laterality Date  . WISDOM TOOTH EXTRACTION  2006   Social History   Tobacco Use  . Smoking status: Never Smoker  . Smokeless tobacco: Never Used  Vaping Use  . Vaping Use: Some days  . Substances: Nicotine, Flavoring  Substance Use Topics  . Alcohol use: Yes    Comment: wine-occ  . Drug use: Yes    Types: Marijuana    Comment: 1-2 times a month   Family History  Problem Relation Age of Onset  . Arthritis Maternal Grandfather   . Kidney failure Maternal Grandfather   . Ovarian cancer Paternal Grandmother   . Breast cancer Mother 42  . Hypertension Mother   . Prostate cancer Paternal Grandfather   . Hypertension Father   . Diabetes Maternal Grandmother   . Breast cancer Maternal Grandmother   . Breast cancer Maternal Aunt   . Breast cancer Maternal Aunt   . Breast cancer Maternal Aunt    Allergies  Allergen Reactions  . Amlodipine Other (See Comments)    Migraine, light/noise sensitivity, eye/facial pain  . Bisoprolol     Chest pressure   . Calan Sr [Verapamil]     Constipation    . Lisinopril     Hair loss and cough    Current Outpatient Medications on File Prior to Visit  Medication Sig Dispense Refill  . chlorthalidone (  HYGROTON) 25 MG tablet Take 0.5 tablets (12.5 mg total) by mouth daily. 30 tablet 1  . fluticasone (FLONASE) 50 MCG/ACT nasal spray Place 1 spray into both nostrils daily. 16 g 0  . ibuprofen (ADVIL) 800 MG tablet Take 1 tablet (800 mg total) by mouth every 8 (eight) hours as needed. 21 tablet 0  . Multiple Vitamins-Minerals (ALIVE WOMENS GUMMY PO) Take 1 tablet by mouth daily.    Marland Kitchen omeprazole (PRILOSEC) 20 MG capsule Take 1 capsule (20 mg total) by mouth 2 (two) times daily. Needs physical appt. before future refills are given 60 capsule 11  . topiramate (TOPAMAX) 25 MG tablet Take 1 tablet (25 mg total) by mouth 2 (two) times daily. 60 tablet 11  .  SUMAtriptan (IMITREX) 100 MG tablet Take 1 tablet (100 mg total) by mouth once for 1 dose. 10 tablet 11   No current facility-administered medications on file prior to visit.    Review of Systems  Constitutional: Negative for activity change, appetite change, fatigue, fever and unexpected weight change.  HENT: Negative for congestion, ear pain, rhinorrhea, sinus pressure and sore throat.   Eyes: Negative for pain, redness and visual disturbance.  Respiratory: Negative for cough, shortness of breath and wheezing.   Cardiovascular: Negative for chest pain and palpitations.  Gastrointestinal: Negative for abdominal pain, blood in stool, constipation and diarrhea.  Endocrine: Negative for polydipsia and polyuria.  Genitourinary: Negative for dysuria, frequency and urgency.  Musculoskeletal: Negative for arthralgias, back pain and myalgias.  Skin: Negative for pallor and rash.  Allergic/Immunologic: Negative for environmental allergies.  Neurological: Negative for dizziness, syncope and headaches.  Hematological: Negative for adenopathy. Does not bruise/bleed easily.  Psychiatric/Behavioral: Negative for decreased concentration and dysphoric mood. The patient is not nervous/anxious.        Stress is a little better  Getting time off from caregiving        Objective:   Physical Exam Constitutional:      General: She is not in acute distress.    Appearance: Normal appearance. She is well-developed. She is obese.  HENT:     Head: Normocephalic and atraumatic.  Eyes:     Conjunctiva/sclera: Conjunctivae normal.     Pupils: Pupils are equal, round, and reactive to light.  Neck:     Thyroid: No thyromegaly.     Vascular: No carotid bruit or JVD.  Cardiovascular:     Rate and Rhythm: Normal rate and regular rhythm.     Heart sounds: Normal heart sounds. No gallop.   Pulmonary:     Effort: Pulmonary effort is normal. No respiratory distress.     Breath sounds: Normal breath sounds. No  wheezing or rales.  Abdominal:     General: Bowel sounds are normal. There is no distension or abdominal bruit.     Palpations: Abdomen is soft. There is no mass.     Tenderness: There is no abdominal tenderness.  Musculoskeletal:     Cervical back: Normal range of motion and neck supple.     Right lower leg: No edema.     Left lower leg: No edema.  Lymphadenopathy:     Cervical: No cervical adenopathy.  Skin:    General: Skin is warm and dry.     Findings: No rash.  Neurological:     Mental Status: She is alert.     Cranial Nerves: No cranial nerve deficit.     Coordination: Coordination normal.     Deep Tendon Reflexes: Reflexes are  normal and symmetric.  Psychiatric:        Mood and Affect: Mood normal.           Assessment & Plan:   Problem List Items Addressed This Visit      Cardiovascular and Mediastinum   Benign essential HTN - Primary    bp in fair control at this time  BP Readings from Last 1 Encounters:  12/24/20 118/70   No changes needed Most recent labs reviewed  Disc lifstyle change with low sodium diet and exercise  Will continue chlorthalidone 12.5 mg (1/2 of 25) daily  Tolerating well  bmet today  Enc more work on lifestyle       Relevant Orders   Basic metabolic panel     Other   Obesity (BMI 30-39.9)    Drinking more water  Enc to keep working on diet lower in processed food

## 2020-12-24 NOTE — Assessment & Plan Note (Signed)
Drinking more water  Enc to keep working on diet lower in processed food

## 2020-12-24 NOTE — Assessment & Plan Note (Signed)
bp in fair control at this time  BP Readings from Last 1 Encounters:  12/24/20 118/70   No changes needed Most recent labs reviewed  Disc lifstyle change with low sodium diet and exercise  Will continue chlorthalidone 12.5 mg (1/2 of 25) daily  Tolerating well  bmet today  Enc more work on lifestyle

## 2020-12-25 LAB — BASIC METABOLIC PANEL
BUN: 16 mg/dL (ref 6–23)
CO2: 30 mEq/L (ref 19–32)
Calcium: 9.8 mg/dL (ref 8.4–10.5)
Chloride: 99 mEq/L (ref 96–112)
Creatinine, Ser: 0.85 mg/dL (ref 0.40–1.20)
GFR: 87.6 mL/min (ref >60.00)
Glucose, Bld: 88 mg/dL (ref 70–99)
Potassium: 4.1 mEq/L (ref 3.5–5.1)
Sodium: 135 mEq/L (ref 135–145)

## 2021-03-10 ENCOUNTER — Other Ambulatory Visit: Payer: Self-pay | Admitting: Family Medicine

## 2021-06-14 DIAGNOSIS — N898 Other specified noninflammatory disorders of vagina: Secondary | ICD-10-CM | POA: Diagnosis not present

## 2021-06-14 DIAGNOSIS — Z6831 Body mass index (BMI) 31.0-31.9, adult: Secondary | ICD-10-CM | POA: Diagnosis not present

## 2021-09-13 ENCOUNTER — Other Ambulatory Visit: Payer: Self-pay | Admitting: Family Medicine

## 2021-09-13 DIAGNOSIS — Z1231 Encounter for screening mammogram for malignant neoplasm of breast: Secondary | ICD-10-CM

## 2021-09-28 ENCOUNTER — Other Ambulatory Visit: Payer: Self-pay | Admitting: Family Medicine

## 2021-10-25 ENCOUNTER — Encounter: Payer: Federal, State, Local not specified - PPO | Admitting: Family Medicine

## 2021-10-31 ENCOUNTER — Encounter: Payer: Self-pay | Admitting: Family Medicine

## 2021-10-31 ENCOUNTER — Ambulatory Visit (INDEPENDENT_AMBULATORY_CARE_PROVIDER_SITE_OTHER): Payer: Federal, State, Local not specified - PPO | Admitting: Family Medicine

## 2021-10-31 ENCOUNTER — Emergency Department
Admission: EM | Admit: 2021-10-31 | Discharge: 2021-10-31 | Disposition: A | Discharge status: Home / Self Care | Payer: Federal, State, Local not specified - PPO | Attending: Emergency Medicine | Admitting: Emergency Medicine

## 2021-10-31 ENCOUNTER — Other Ambulatory Visit: Payer: Self-pay

## 2021-10-31 DIAGNOSIS — D72829 Elevated white blood cell count, unspecified: Secondary | ICD-10-CM | POA: Diagnosis not present

## 2021-10-31 DIAGNOSIS — I10 Essential (primary) hypertension: Secondary | ICD-10-CM | POA: Insufficient documentation

## 2021-10-31 DIAGNOSIS — E876 Hypokalemia: Secondary | ICD-10-CM | POA: Insufficient documentation

## 2021-10-31 DIAGNOSIS — E860 Dehydration: Secondary | ICD-10-CM | POA: Diagnosis not present

## 2021-10-31 DIAGNOSIS — J019 Acute sinusitis, unspecified: Secondary | ICD-10-CM | POA: Insufficient documentation

## 2021-10-31 DIAGNOSIS — E871 Hypo-osmolality and hyponatremia: Secondary | ICD-10-CM | POA: Insufficient documentation

## 2021-10-31 DIAGNOSIS — R1110 Vomiting, unspecified: Secondary | ICD-10-CM | POA: Diagnosis not present

## 2021-10-31 DIAGNOSIS — K529 Noninfective gastroenteritis and colitis, unspecified: Secondary | ICD-10-CM | POA: Insufficient documentation

## 2021-10-31 DIAGNOSIS — Z20822 Contact with and (suspected) exposure to covid-19: Secondary | ICD-10-CM | POA: Diagnosis not present

## 2021-10-31 LAB — COMPREHENSIVE METABOLIC PANEL
ALT: 17 U/L (ref 0–44)
AST: 25 U/L (ref 15–41)
Albumin: 3.8 g/dL (ref 3.5–5.0)
Alkaline Phosphatase: 59 U/L (ref 38–126)
Anion gap: 9 (ref 5–15)
BUN: 22 mg/dL — ABNORMAL HIGH (ref 6–20)
CO2: 25 mmol/L (ref 22–32)
Calcium: 8.4 mg/dL — ABNORMAL LOW (ref 8.9–10.3)
Chloride: 98 mmol/L (ref 98–111)
Creatinine, Ser: 0.9 mg/dL (ref 0.44–1.00)
GFR, Estimated: >60 mL/min (ref >60)
Glucose, Bld: 133 mg/dL — ABNORMAL HIGH (ref 70–99)
Potassium: 3.2 mmol/L — ABNORMAL LOW (ref 3.5–5.1)
Sodium: 132 mmol/L — ABNORMAL LOW (ref 135–145)
Total Bilirubin: 0.8 mg/dL (ref 0.3–1.2)
Total Protein: 7.2 g/dL (ref 6.5–8.1)

## 2021-10-31 LAB — RESP PANEL BY RT-PCR (FLU A&B, COVID) ARPGX2
Influenza A by PCR: NEGATIVE
Influenza B by PCR: NEGATIVE
SARS Coronavirus 2 by RT PCR: NEGATIVE

## 2021-10-31 LAB — CBC
HCT: 37.2 % (ref 36.0–46.0)
Hemoglobin: 12 g/dL (ref 12.0–15.0)
MCH: 26.8 pg (ref 26.0–34.0)
MCHC: 32.3 g/dL (ref 30.0–36.0)
MCV: 83 fL (ref 80.0–100.0)
Platelets: 307 10*3/uL (ref 150–400)
RBC: 4.48 MIL/uL (ref 3.87–5.11)
RDW: 12.8 % (ref 11.5–15.5)
WBC: 10.9 10*3/uL — ABNORMAL HIGH (ref 4.0–10.5)
nRBC: 0 % (ref 0.0–0.2)

## 2021-10-31 LAB — URINALYSIS, MICROSCOPIC (REFLEX)

## 2021-10-31 LAB — URINALYSIS, ROUTINE W REFLEX MICROSCOPIC
Glucose, UA: NEGATIVE mg/dL
Ketones, ur: NEGATIVE mg/dL
Leukocytes,Ua: NEGATIVE
Nitrite: NEGATIVE
Protein, ur: 100 mg/dL — AB
Specific Gravity, Urine: >1.03 — ABNORMAL HIGH (ref 1.005–1.030)
pH: 6 (ref 5.0–8.0)

## 2021-10-31 LAB — POC URINE PREG, ED
Preg Test, Ur: NEGATIVE
Preg Test, Ur: NEGATIVE

## 2021-10-31 LAB — LIPASE, BLOOD: Lipase: 33 U/L (ref 11–51)

## 2021-10-31 MED ORDER — AMOXICILLIN-POT CLAVULANATE 875-125 MG PO TABS: 1.0000 | ORAL_TABLET | Freq: Two times a day (BID) | ORAL | 0 refills | Status: AC

## 2021-10-31 MED ORDER — ONDANSETRON 4 MG PO TBDP: 4.0000 mg | ORAL_TABLET | Freq: Three times a day (TID) | ORAL | 0 refills | Status: AC | PRN

## 2021-10-31 MED ORDER — POTASSIUM CHLORIDE CRYS ER 20 MEQ PO TBCR
40.0000 meq | EXTENDED_RELEASE_TABLET | Freq: Once | ORAL | Status: AC
  Administered 2021-10-31: 40 meq via ORAL
  Filled 2021-10-31: qty 2

## 2021-10-31 MED ORDER — SODIUM CHLORIDE 0.9 % IV BOLUS
1000.0000 mL | Freq: Once | INTRAVENOUS | Status: AC
  Administered 2021-10-31: 1000 mL via INTRAVENOUS

## 2021-10-31 NOTE — Assessment & Plan Note (Signed)
Cold symptoms followed by n/v/d all night  ?Unable to keep anything down  ?Has not done a covid test  ?Ill appearing ?bp of 79/54 with pulse of 105  ? ?Adv ER now for eval and IVF ?She refuses ambulance and insists on driving herself  ?Discussed precautions for calling 911 (if dizzy while driving pull over) and she voiced understanding  ? ?

## 2021-10-31 NOTE — Patient Instructions (Signed)
Please go to [facility] ER now   ?You need evaluation for dehydration and IV fluids  ? ?Call EMS if you get dizzy on the way  ? ?Please keep [place] posted  ?

## 2021-10-31 NOTE — ED Triage Notes (Signed)
Pt states she started feeling sick this past weekend. Pt with c/o congestion and N/V/D that started last evening. Pt states she feels weak. Pt with occasional cough.  ?

## 2021-10-31 NOTE — ED Notes (Signed)
Pt unable to urinate more than a few drops at this time, urine sample used for POC preg, not enough for urinalysis. ?

## 2021-10-31 NOTE — Discharge Instructions (Addendum)
She can take Tylenol 1 g every 8 hours and ibuprofen 400 every 6-8 hours to help with any pain.  Use Zofran to help with nausea and the antibiotics for concern for sinusitis and try to keep pushing the fluids and return to the ER if you feel worse ?

## 2021-10-31 NOTE — ED Provider Notes (Addendum)
? ?[facility] ?Provider Note ? ? ? Event Date/Time  ? First MD Initiated Contact with Patient 10/31/21 1601   ?  (approximate) ? ? ?History  ? ?Emesis ? ? ?HPI ? ?Ashlee Perry is a 36 y.o. female with hypertension who comes in with emesis.  Patient reports having congestion, cough, sinus pressure, thick sinus mucus for the past 1 week.  She reports that yesterday she went out to eat and then developed nausea vomiting and diarrhea afterwards.  She states she felt really weak from this and dehydrated and was told to come to the ER for IV fluids.  Patient already got a liter of IV fluids in triage and reports feeling better after this.  She denies any abdominal pain. ? ?I reviewed the note from the office visit from Dr. Milinda Antis patient was hypotensive and tachycardic and I recommended going to the ER concerns for dehydration. ? ?  ? ? ?Physical Exam  ? ?Triage Vital Signs: ?ED Triage Vitals  ?Enc Vitals Group  ?   BP 10/31/21 1217 (!) 86/63  ?   Pulse Rate 10/31/21 1216 94  ?   Resp 10/31/21 1216 20  ?   Temp 10/31/21 1216 98.6 ?F (37 ?C)  ?   Temp Source 10/31/21 1216 Oral  ?   SpO2 10/31/21 1216 98 %  ?   Weight 10/31/21 1217 181 lb (82.1 kg)  ?   Height 10/31/21 1217 5\' 4"  (1.626 m)  ?   Head Circumference --   ?   Peak Flow --   ?   Pain Score 10/31/21 1217 9  ?   Pain Loc --   ?   Pain Edu? --   ?   Excl. in GC? --   ? ? ?Most recent vital signs: ?Vitals:  ? 10/31/21 1217 10/31/21 1603  ?BP: (!) 86/63 105/66  ?Pulse:  88  ?Resp:  18  ?Temp:    ?SpO2:  98%  ? ? ? ?General: Awake, no distress.  ?CV:  Good peripheral perfusion.  ?Resp:  Normal effort.  ?Abd:  No distention.  Soft and nontender ?Other:  No swelling noted ? ? ?ED Results / Procedures / Treatments  ? ?Labs ?(all labs ordered are listed, but only abnormal results are displayed) ? ?PROCEDURES: ? ?Critical Care performed: No ? ?Procedures ? ? ?MEDICATIONS ORDERED IN ED: ?Medications  ?sodium chloride 0.9 % bolus 1,000 mL (0 mLs  Intravenous Stopped 10/31/21 1551)  ?potassium chloride SA (KLOR-CON M) CR tablet 40 mEq (40 mEq Oral Given 10/31/21 1610)  ? ? ? ?IMPRESSION / MDM / ASSESSMENT AND PLAN / ED COURSE  ?I reviewed the triage vital signs and the nursing notes. ?             ?               ? ?Differential diagnosis includes, but is not limited to, most likely viral gastroenteritis.  Abdomen soft and nontender low suspicion for appendicitis.  Patient was initially hypotensive but after a liter of fluid she is feeling better and normal normotensive with normal heart rate.  We will get COVID test but she is out of the window for paxlovid.  Given she is now day 7 of the symptoms she is requesting antibiotics for sinusitis.  Discussed with her that typically this does not start till day 10 but we will prescribe it if she continues to have symptoms.  She expressed understanding felt comfortable with  this plan. ? ?Pregnancy test was negative.  Lipase normal.  CMP shows slightly low potassium and slightly low sodium we will give some repletion CBC shows slightly elevated white count ? ? ?5:50 PM repeat abdominal exam soft and nontender.  Patient is tolerating p.o. and feels comfortable with discharge home. She is ambulatory without feeling like she is going to pass out.  Blood pressure is much better after fluids. ? ?FINAL CLINICAL IMPRESSION(S) / ED DIAGNOSES  ? ?Final diagnoses:  ?Acute sinusitis, recurrence not specified, unspecified location  ?Dehydration  ?Gastroenteritis  ? ? ? ?Rx / DC Orders  ? ?ED Discharge Orders   ? ?      Ordered  ?  ondansetron (ZOFRAN-ODT) 4 MG disintegrating tablet  Every 8 hours PRN       ? 10/31/21 1752  ?  amoxicillin-clavulanate (AUGMENTIN) 875-125 MG tablet  2 times daily       ? 10/31/21 1752  ? ?  ?  ? ?  ? ? ? ?Note:  This document was prepared using Dragon voice recognition software and may include unintentional dictation errors. ?  ?Concha Se, MD ?10/31/21 1753 ? ?  ?Concha Se, MD ?11/01/21  1029 ? ?

## 2021-10-31 NOTE — Progress Notes (Signed)
? ?  Subjective:  ? ? Patient ID: Ashlee Perry, female    DOB: 01-03-1986, 36 y.o.   MRN: [id number] ? ?This visit occurred during the SARS-CoV-2 public health emergency.  Safety protocols were in place, including screening questions prior to the visit, additional usage of staff PPE, and extensive cleaning of exam room while observing appropriate contact time as indicated for disinfecting solutions.  ? ?HPI ?Was here for PE but very sick  ? ?Started with pnd /cold symptoms  ?N/v/d all night  ?Unable to keep anything down  ?Dehydrated ? ?BP Readings from Last 3 Encounters:  ?10/31/21 (!) 79/54  ?12/24/20 118/70  ?12/10/20 128/90  ? ?Pulse Readings from Last 3 Encounters:  ?10/31/21 (!) 105  ?12/24/20 88  ?12/10/20 79  ? ? ? ? ?Review of Systems ?Nausea and vomiting  ?Light headed this am /better now  ?Fatigue  ?No abd pain  ?Runny nose and pnd  ?   ?Objective:  ? Physical Exam ?Constitutional:   ?   Appearance: She is ill-appearing and diaphoretic.  ?Cardiovascular:  ?   Rate and Rhythm: Tachycardia present.  ?Skin: ?   Coloration: Skin is pale. Skin is not jaundiced.  ?Psychiatric:     ?   Mood and Affect: Mood normal.  ? ? ? ? ? ?   ?Assessment & Plan:  ? ?Problem List Items Addressed This Visit   ? ?  ? Other  ? Dehydration  ?  Cold symptoms followed by n/v/d all night  ?Unable to keep anything down  ?Has not done a covid test  ?Ill appearing ?bp of 79/54 with pulse of 105  ? ?Adv ER now for eval and IVF ?She refuses ambulance and insists on driving herself  ?Discussed precautions for calling 911 (if dizzy while driving pull over) and she voiced understanding  ? ?  ?  ? ? ? ?

## 2021-10-31 NOTE — ED Notes (Signed)
Pt given crackers and water at this time.

## 2021-10-31 NOTE — ED Provider Notes (Incomplete Revision)
[facility] Provider Note    Event Date/Time   First MD Initiated Contact with Patient 10/31/21 1601     (approximate)   History   Emesis   HPI  Ashlee Perry is a 36 y.o. female with hypertension who comes in with emesis.  Patient reports having congestion, cough, sinus pressure, thick sinus mucus for the past 1 week.  She reports that yesterday she went out to eat and then developed nausea vomiting and diarrhea afterwards.  She states she felt really weak from this and dehydrated and was told to come to the ER for IV fluids.  Patient already got a liter of IV fluids in triage and reports feeling better after this.  She denies any abdominal pain.  I reviewed the note from the office visit from Dr. Milinda Antis patient was hypotensive and tachycardic and I recommended going to the ER concerns for dehydration.      Physical Exam   Triage Vital Signs: ED Triage Vitals  Enc Vitals Group     BP 10/31/21 1217 (!) 86/63     Pulse Rate 10/31/21 1216 94     Resp 10/31/21 1216 20     Temp 10/31/21 1216 98.6 F (37 C)     Temp Source 10/31/21 1216 Oral     SpO2 10/31/21 1216 98 %     Weight 10/31/21 1217 181 lb (82.1 kg)     Height 10/31/21 1217 5\' 4"  (1.626 m)     Head Circumference --      Peak Flow --      Pain Score 10/31/21 1217 9     Pain Loc --      Pain Edu? --      Excl. in GC? --     Most recent vital signs: Vitals:   10/31/21 1217 10/31/21 1603  BP: (!) 86/63 105/66  Pulse:  88  Resp:  18  Temp:    SpO2:  98%     General: Awake, no distress.  CV:  Good peripheral perfusion.  Resp:  Normal effort.  Abd:  No distention.  Soft and nontender Other:  No swelling noted   ED Results / Procedures / Treatments   Labs (all labs ordered are listed, but only abnormal results are displayed)  PROCEDURES:  Critical Care performed: No  Procedures   MEDICATIONS ORDERED IN ED: Medications  sodium chloride 0.9 % bolus 1,000 mL (0 mLs  Intravenous Stopped 10/31/21 1551)     IMPRESSION / MDM / ASSESSMENT AND PLAN / ED COURSE  I reviewed the triage vital signs and the nursing notes.                              Differential diagnosis includes, but is not limited to, most likely viral gastroenteritis.  Abdomen soft and nontender low suspicion for appendicitis.  Patient was initially hypotensive but after a liter of fluid she is feeling better and normal normotensive with normal heart rate.  We will get COVID test but she is out of the window for paxlovid.  Given she is now day 7 of the symptoms she is requesting antibiotics for sinusitis.  Discussed with her that typically this does not start till day 10 but we will prescribe it if she continues to have symptoms.  She expressed understanding felt comfortable with this plan.  Pregnancy test was negative.  Lipase normal.  CMP shows slightly low potassium  and slightly low sodium we will give some repletion CBC shows slightly elevated white count   5:50 PM repeat abdominal exam soft and nontender.  Patient is tolerating p.o. and feels comfortable with discharge home.  She is ambulatory without feeling like she is going to pass out.  Blood pressure is much better after fluids.  FINAL CLINICAL IMPRESSION(S) / ED DIAGNOSES   Final diagnoses:  Acute sinusitis, recurrence not specified, unspecified location  Dehydration  Gastroenteritis     Rx / DC Orders   ED Discharge Orders          Ordered    ondansetron (ZOFRAN-ODT) 4 MG disintegrating tablet  Every 8 hours PRN        10/31/21 1752    amoxicillin-clavulanate (AUGMENTIN) 875-125 MG tablet  2 times daily        10/31/21 1752             Note:  This document was prepared using Dragon voice recognition software and may include unintentional dictation errors.   Concha Se, MD 10/31/21 [phone]

## 2021-11-07 ENCOUNTER — Ambulatory Visit
Admission: RE | Admit: 2021-11-07 | Discharge: 2021-11-07 | Disposition: A | Discharge status: Home / Self Care | Payer: Federal, State, Local not specified - PPO | Source: Ambulatory Visit | Attending: Family Medicine | Admitting: Family Medicine

## 2021-11-07 DIAGNOSIS — Z1231 Encounter for screening mammogram for malignant neoplasm of breast: Secondary | ICD-10-CM | POA: Diagnosis not present

## 2021-11-08 ENCOUNTER — Other Ambulatory Visit: Payer: Self-pay | Admitting: Family Medicine

## 2021-11-08 DIAGNOSIS — R928 Other abnormal and inconclusive findings on diagnostic imaging of breast: Secondary | ICD-10-CM

## 2021-11-18 ENCOUNTER — Telehealth: Payer: Self-pay | Admitting: Family Medicine

## 2021-11-18 ENCOUNTER — Other Ambulatory Visit: Payer: Self-pay

## 2021-11-18 ENCOUNTER — Encounter: Payer: Self-pay | Admitting: Family Medicine

## 2021-11-18 ENCOUNTER — Ambulatory Visit (INDEPENDENT_AMBULATORY_CARE_PROVIDER_SITE_OTHER): Payer: Federal, State, Local not specified - PPO | Admitting: Family Medicine

## 2021-11-18 VITALS — BP 126/90 | HR 82 | Temp 97.5°F | Ht 64.5 in | Wt 183.4 lb

## 2021-11-18 DIAGNOSIS — I10 Essential (primary) hypertension: Secondary | ICD-10-CM | POA: Diagnosis not present

## 2021-11-18 DIAGNOSIS — Z803 Family history of malignant neoplasm of breast: Secondary | ICD-10-CM

## 2021-11-18 DIAGNOSIS — K219 Gastro-esophageal reflux disease without esophagitis: Secondary | ICD-10-CM | POA: Diagnosis not present

## 2021-11-18 DIAGNOSIS — Z79899 Other long term (current) drug therapy: Secondary | ICD-10-CM | POA: Diagnosis not present

## 2021-11-18 DIAGNOSIS — D509 Iron deficiency anemia, unspecified: Secondary | ICD-10-CM

## 2021-11-18 DIAGNOSIS — Z0000 Encounter for general adult medical examination without abnormal findings: Secondary | ICD-10-CM

## 2021-11-18 DIAGNOSIS — R7303 Prediabetes: Secondary | ICD-10-CM

## 2021-11-18 DIAGNOSIS — E559 Vitamin D deficiency, unspecified: Secondary | ICD-10-CM

## 2021-11-18 DIAGNOSIS — E669 Obesity, unspecified: Secondary | ICD-10-CM

## 2021-11-18 LAB — COMPREHENSIVE METABOLIC PANEL
ALT: 14 U/L (ref 0–35)
AST: 19 U/L (ref 0–37)
Albumin: 4.1 g/dL (ref 3.5–5.2)
Alkaline Phosphatase: 49 U/L (ref 39–117)
BUN: 12 mg/dL (ref 6–23)
CO2: 30 mEq/L (ref 19–32)
Calcium: 9.8 mg/dL (ref 8.4–10.5)
Chloride: 101 mEq/L (ref 96–112)
Creatinine, Ser: 0.79 mg/dL (ref 0.40–1.20)
GFR: 95.04 mL/min (ref >60.00)
Glucose, Bld: 98 mg/dL (ref 70–99)
Potassium: 4.3 mEq/L (ref 3.5–5.1)
Sodium: 137 mEq/L (ref 135–145)
Total Bilirubin: 0.6 mg/dL (ref 0.2–1.2)
Total Protein: 6.9 g/dL (ref 6.0–8.3)

## 2021-11-18 LAB — TSH: TSH: 1.76 u[IU]/mL (ref 0.35–5.50)

## 2021-11-18 LAB — CBC WITH DIFFERENTIAL/PLATELET
Basophils Absolute: 0.1 10*3/uL (ref 0.0–0.1)
Basophils Relative: 0.7 % (ref 0.0–3.0)
Eosinophils Absolute: 0 10*3/uL (ref 0.0–0.7)
Eosinophils Relative: 0.7 % (ref 0.0–5.0)
HCT: 36.4 % (ref 36.0–46.0)
Hemoglobin: 12 g/dL (ref 12.0–15.0)
Lymphocytes Relative: 31.5 % (ref 12.0–46.0)
Lymphs Abs: 2.3 10*3/uL (ref 0.7–4.0)
MCHC: 33 g/dL (ref 30.0–36.0)
MCV: 83.1 fl (ref 78.0–100.0)
Monocytes Absolute: 0.6 10*3/uL (ref 0.1–1.0)
Monocytes Relative: 8.2 % (ref 3.0–12.0)
Neutro Abs: 4.3 10*3/uL (ref 1.4–7.7)
Neutrophils Relative %: 58.9 % (ref 43.0–77.0)
Platelets: 359 10*3/uL (ref 150.0–400.0)
RBC: 4.38 Mil/uL (ref 3.87–5.11)
RDW: 13.6 % (ref 11.5–15.5)
WBC: 7.3 10*3/uL (ref 4.0–10.5)

## 2021-11-18 LAB — LIPID PANEL
Cholesterol: 176 mg/dL (ref 0–200)
HDL: 48.7 mg/dL (ref >39.00)
LDL Cholesterol: 101 mg/dL — ABNORMAL HIGH (ref 0–99)
NonHDL: 127.27
Total CHOL/HDL Ratio: 4
Triglycerides: 129 mg/dL (ref 0.0–149.0)
VLDL: 25.8 mg/dL (ref 0.0–40.0)

## 2021-11-18 LAB — VITAMIN B12: Vitamin B-12: 1141 pg/mL — ABNORMAL HIGH (ref 211–911)

## 2021-11-18 LAB — HEMOGLOBIN A1C: Hgb A1c MFr Bld: 6.2 % (ref 4.6–6.5)

## 2021-11-18 LAB — IRON: Iron: 82 ug/dL (ref 42–145)

## 2021-11-18 LAB — VITAMIN D 25 HYDROXY (VIT D DEFICIENCY, FRACTURES): VITD: 24.02 ng/mL — ABNORMAL LOW (ref 30.00–100.00)

## 2021-11-18 NOTE — Assessment & Plan Note (Signed)
Vitamin D level added to labs today ?Discussed importance for bone and overall health ?

## 2021-11-18 NOTE — Progress Notes (Signed)
Subjective:    Patient ID: Ashlee Perry, female    DOB: 26-Jun-1986, 36 y.o.   MRN: [id number]  This visit occurred during the SARS-CoV-2 public health emergency.  Safety protocols were in place, including screening questions prior to the visit, additional usage of staff PPE, and extensive cleaning of exam room while observing appropriate contact time as indicated for disinfecting solutions.   HPI Here for health maintenance exam and to review chronic medical problems    Wt Readings from Last 3 Encounters:  11/18/21 183 lb 6.4 oz (83.2 kg)  10/31/21 181 lb (82.1 kg)  10/31/21 181 lb (82.1 kg)   30.99 kg/m  Cares for uncle with dementia  Work is active -she tracks her steps No extra exercise  Thinking about a bicycle   Last visit pt was sent to ER for dehydration following vomiting  Was treated for sinusitis  Still cough and hoarseness  Otc zyrtec, helps some  Cannot tolerate expectorant  Still some yellow mucous    Flu shot-declines Covid shot-declines  Tdap 10/2014   Pap 09/2019 normal  with neg HPV Not sexually active right now  Periods used to last 5 d , now 7 days Not as heavy the whole time  Tolerating periods  Takes iron just during period and eats iron rich foods     Does not need birth control    Mammogram 10/2021 asymmetry in L breast  Has a re check on 11/29/21 Mother had breast cancer at 76 Also maternal aunt and GM Self breast exam : no changes or lumps , she watches carefully     HTN bp is stable today  No cp or palpitations or headaches or edema  No side effects to medicines  BP Readings from Last 3 Encounters:  11/18/21 126/90  10/31/21 124/88  10/31/21 (!) 79/54     Pulse Readings from Last 3 Encounters:  11/18/21 82  10/31/21 82  10/31/21 (!) 105    Chlorthaladone 12.5 mg daily (did not take it today) Has not tolerated ca channel or beta blockers so far  Drinking more water   Lab Results  Component Value Date   CREATININE  0.90 10/31/2021   BUN 22 (H) 10/31/2021   NA 132 (L) 10/31/2021   K 3.2 (L) 10/31/2021   CL 98 10/31/2021   CO2 25 10/31/2021   Glucose 133 Ca 8.4   Lab Results  Component Value Date   ALT 17 10/31/2021   AST 25 10/31/2021   ALKPHOS 59 10/31/2021   BILITOT 0.8 10/31/2021    Migraines Topamax 25 mg bid   GERD Omeprazole 20 mg bid  Anemia Lab Results  Component Value Date   WBC 10.9 (H) 10/31/2021   HGB 12.0 10/31/2021   HCT 37.2 10/31/2021   MCV 83.0 10/31/2021   PLT 307 10/31/2021  Takes iron before her period and during it   Prediabetes Lab Results  Component Value Date   HGBA1C 5.8 10/24/2020  Cutting back starches and sweets the best she can  Small portions  Very mindful  Less soda also    Patient Active Problem List   Diagnosis Date Noted   Current use of proton pump inhibitor 11/18/2021   Chest pressure 12/10/2020   Benign essential HTN 10/24/2020   Hair loss 10/24/2020   Iron deficiency anemia 09/20/2018   Migraine headache without aura 09/20/2018   Prediabetes 09/13/2018   Obesity (BMI 30-39.9) 12/31/2016   Breast cancer screening by mammogram 12/31/2016  Encounter for routine gynecological examination 10/18/2014   Screen for STD (sexually transmitted disease) 10/18/2014   Routine general medical examination at a health care facility 10/01/2014   Vitamin D deficiency 04/24/2014   GERD (gastroesophageal reflux disease) 04/24/2014   Family history of breast cancer 04/24/2014   Past Medical History:  Diagnosis Date   History of gastroesophageal reflux (GERD)    Migraine    Past Surgical History:  Procedure Laterality Date   WISDOM TOOTH EXTRACTION  2006   Social History   Tobacco Use   Smoking status: Never   Smokeless tobacco: Never  Vaping Use   Vaping Use: Some days   Substances: Nicotine, Flavoring  Substance Use Topics   Alcohol use: Yes    Comment: wine-occ   Drug use: Yes    Types: Marijuana    Comment: 1-2 times a month    Family History  Problem Relation Age of Onset   Arthritis Maternal Grandfather    Kidney failure Maternal Grandfather    Ovarian cancer Paternal Grandmother    Breast cancer Mother 70   Hypertension Mother    Prostate cancer Paternal Grandfather    Hypertension Father    Diabetes Maternal Grandmother    Breast cancer Maternal Grandmother    Breast cancer Maternal Aunt    Breast cancer Maternal Aunt    Breast cancer Maternal Aunt    Allergies  Allergen Reactions   Amlodipine Other (See Comments)    Migraine, light/noise sensitivity, eye/facial pain   Bisoprolol     Chest pressure    Calan Sr [Verapamil]     Constipation     Lisinopril     Hair loss and cough    Current Outpatient Medications on File Prior to Visit  Medication Sig Dispense Refill   chlorthalidone (HYGROTON) 25 MG tablet TAKE 1/2 TABLET BY MOUTH EVERY DAY 45 tablet 0   fluticasone (FLONASE) 50 MCG/ACT nasal spray Place 1 spray into both nostrils daily. 16 g 0   ibuprofen (ADVIL) 800 MG tablet Take 1 tablet (800 mg total) by mouth every 8 (eight) hours as needed. 21 tablet 0   Multiple Vitamins-Minerals (ALIVE WOMENS GUMMY PO) Take 1 tablet by mouth daily.     omeprazole (PRILOSEC) 20 MG capsule Take 1 capsule (20 mg total) by mouth 2 (two) times daily. Needs physical appt. before future refills are given 60 capsule 11   topiramate (TOPAMAX) 25 MG tablet Take 1 tablet (25 mg total) by mouth 2 (two) times daily. 60 tablet 11   SUMAtriptan (IMITREX) 100 MG tablet Take 1 tablet (100 mg total) by mouth once for 1 dose. 10 tablet 11   No current facility-administered medications on file prior to visit.     Review of Systems  Constitutional:  Positive for fatigue. Negative for activity change, appetite change, fever and unexpected weight change.  HENT:  Negative for congestion, ear pain, rhinorrhea, sinus pressure and sore throat.   Eyes:  Negative for pain, redness and visual disturbance.  Respiratory:   Negative for cough, shortness of breath and wheezing.   Cardiovascular:  Negative for chest pain and palpitations.  Gastrointestinal:  Negative for abdominal pain, blood in stool, constipation and diarrhea.  Endocrine: Negative for polydipsia and polyuria.  Genitourinary:  Negative for dysuria, frequency and urgency.  Musculoskeletal:  Negative for arthralgias, back pain and myalgias.  Skin:  Negative for pallor and rash.  Allergic/Immunologic: Negative for environmental allergies.  Neurological:  Negative for dizziness, syncope and headaches.  Hematological:  Negative for adenopathy. Does not bruise/bleed easily.  Psychiatric/Behavioral:  Negative for decreased concentration and dysphoric mood. The patient is not nervous/anxious.       Objective:   Physical Exam Constitutional:      General: She is not in acute distress.    Appearance: Normal appearance. She is well-developed. She is obese. She is not ill-appearing or diaphoretic.  HENT:     Head: Normocephalic and atraumatic.     Right Ear: Tympanic membrane, ear canal and external ear normal.     Left Ear: Tympanic membrane, ear canal and external ear normal.     Nose: Nose normal. No congestion.     Mouth/Throat:     Mouth: Mucous membranes are moist.     Pharynx: Oropharynx is clear. No posterior oropharyngeal erythema.  Eyes:     General: No scleral icterus.    Extraocular Movements: Extraocular movements intact.     Conjunctiva/sclera: Conjunctivae normal.     Pupils: Pupils are equal, round, and reactive to light.  Neck:     Thyroid: No thyromegaly.     Vascular: No carotid bruit or JVD.  Cardiovascular:     Rate and Rhythm: Normal rate and regular rhythm.     Pulses: Normal pulses.     Heart sounds: Normal heart sounds.    No gallop.  Pulmonary:     Effort: Pulmonary effort is normal. No respiratory distress.     Breath sounds: Normal breath sounds. No wheezing.     Comments: Good air exch Chest:     Chest wall:  No tenderness.  Abdominal:     General: Bowel sounds are normal. There is no distension or abdominal bruit.     Palpations: Abdomen is soft. There is no mass.     Tenderness: There is no abdominal tenderness.     Hernia: No hernia is present.  Genitourinary:    Comments: Breast exam: No mass, nodules, thickening, tenderness, bulging, retraction, inflamation, nipple discharge or skin changes noted.  No axillary or clavicular LA.     Musculoskeletal:        General: No tenderness. Normal range of motion.     Cervical back: Normal range of motion and neck supple. No rigidity. No muscular tenderness.     Right lower leg: No edema.     Left lower leg: No edema.     Comments: No kyphosis   Lymphadenopathy:     Cervical: No cervical adenopathy.  Skin:    General: Skin is warm and dry.     Coloration: Skin is not pale.     Findings: No erythema or rash.     Comments: Skin tags on neck  Neurological:     Mental Status: She is alert. Mental status is at baseline.     Cranial Nerves: No cranial nerve deficit.     Motor: No abnormal muscle tone.     Coordination: Coordination normal.     Gait: Gait normal.     Deep Tendon Reflexes: Reflexes are normal and symmetric. Reflexes normal.  Psychiatric:        Mood and Affect: Mood normal.        Cognition and Memory: Cognition and memory normal.          Assessment & Plan:   Problem List Items Addressed This Visit       Cardiovascular and Mediastinum   Benign essential HTN    bp in fair control at this time  BP Readings from Last  1 Encounters:  11/18/21 126/90  No changes needed Forgot med today so usually diastolic is below 90 Most recent labs reviewed  Disc lifstyle change with low sodium diet and exercise  Plan to continue chlorthalidone 12.5 mg daily       Relevant Orders   CBC with Differential/Platelet   Comprehensive metabolic panel   Lipid panel   TSH     Digestive   GERD (gastroesophageal reflux disease)     Continues omeprazole 20 mg twice daily        Other   Current use of proton pump inhibitor    Vitamin B12 level added to labs today      Relevant Orders   Vitamin B12   Family history of breast cancer    Patient would be interested in a genetics consult if affordable      Iron deficiency anemia    Patient takes iron over-the-counter for this several days before her period through. This is worked well for her CBC and iron level ordered      Relevant Orders   CBC with Differential/Platelet   Iron   Obesity (BMI 30-39.9)    Discussed how this problem influences overall health and the risks it imposes  Reviewed plan for weight loss with lower calorie diet (via better food choices and also portion control or program like weight watchers) and exercise building up to or more than 30 minutes 5 days per week including some aerobic activity  Wt is up 2 lb  Little time for self care         Prediabetes    Hemoglobin A1c ordered Reviewed low glycemic diet habits      Relevant Orders   Hemoglobin A1c   Routine general medical examination at a health care facility - Primary    Reviewed health habits including diet and exercise and skin cancer prevention Reviewed appropriate screening tests for age  Also reviewed health mt list, fam hx and immunization status , as well as social and family history   See HPI  labs ordered Declines flu shot and COVID-vaccine Pap normal in 2021 with a negative HPV screen Patient declines need for contraception Mammogram done earlier this month and asymmetry found in left breast, reimaging is upcoming at the end of the month Nothing new noted on breast exam today      Vitamin D deficiency    Vitamin D level added to labs today Discussed importance for bone and overall health      Relevant Orders   VITAMIN D 25 Hydroxy (Vit-D Deficiency, Fractures)

## 2021-11-18 NOTE — Assessment & Plan Note (Signed)
bp in fair control at this time  ?BP Readings from Last 1 Encounters:  ?11/18/21 126/90  ? ?No changes needed ?Forgot med today so usually diastolic is below 90 ?Most recent labs reviewed  ?Disc lifstyle change with low sodium diet and exercise  ?Plan to continue chlorthalidone 12.5 mg daily ? ?

## 2021-11-18 NOTE — Assessment & Plan Note (Signed)
Continues omeprazole 20 mg twice daily ?

## 2021-11-18 NOTE — Assessment & Plan Note (Signed)
Vitamin B12 level added to labs today ?

## 2021-11-18 NOTE — Telephone Encounter (Signed)
Please let pt know we reached out to the genetic testing team (with oncology dept) and I would like to refer her for a visit to discuss genetic testing in light of her very strong family h/o breast cancer  ? ?Most folks in her situation (they tell me) end up spending 100$ or less but I cannot guarantee that and it would need to be looked into further before she goes forward with the actual tests ? ?Would she like to sit down with someone to discuss it? If so let me know and I will refer  ? ? ?

## 2021-11-18 NOTE — Assessment & Plan Note (Signed)
Reviewed health habits including diet and exercise and skin cancer prevention ?Reviewed appropriate screening tests for age  ?Also reviewed health mt list, fam hx and immunization status , as well as social and family history   ?See HPI ? labs ordered ?Declines flu shot and COVID-vaccine ?Pap normal in 2021 with a negative HPV screen ?Patient declines need for contraception ?Mammogram done earlier this month and asymmetry found in left breast, reimaging is upcoming at the end of the month ?Nothing new noted on breast exam today ?

## 2021-11-18 NOTE — Assessment & Plan Note (Signed)
Patient takes iron over-the-counter for this several days before her period through. ?This is worked well for her ?CBC and iron level ordered ?

## 2021-11-18 NOTE — Assessment & Plan Note (Signed)
Hemoglobin A1c ordered ?Reviewed low glycemic diet habits ?

## 2021-11-18 NOTE — Assessment & Plan Note (Signed)
Patient would be interested in a genetics consult if affordable ?

## 2021-11-18 NOTE — Patient Instructions (Addendum)
A good book for caregivers of dementia patients called the 36 hour day  ? ?Add 30 minutes of exercise daily  ?Get a bike! ?Walk  ? ?Your lungs sound clear  ?Continue flonase and zyrtec  ?Wear a mask around dust if possible  ?If cough worsens or does not improve in 2 weeks let us know  ? ?Labs today  ? ? ? ? ?

## 2021-11-18 NOTE — Assessment & Plan Note (Signed)
Discussed how this problem influences overall health and the risks it imposes  ?Reviewed plan for weight loss with lower calorie diet (via better food choices and also portion control or program like weight watchers) and exercise building up to or more than 30 minutes 5 days per week including some aerobic activity  ?Wt is up 2 lb  ?Little time for self care  ? ? ?

## 2021-11-19 NOTE — Telephone Encounter (Signed)
Called and lvm for patient to call us back. 

## 2021-11-20 NOTE — Telephone Encounter (Signed)
The referral is in.

## 2021-11-20 NOTE — Telephone Encounter (Signed)
Called patient she would like to have referral started she would like to go to [facility].  ?

## 2021-11-20 NOTE — Telephone Encounter (Signed)
[facility] Night - Client ?Nonclinical Telephone Record  ?Access[employer]urse? ?Client [facility] Night - Client ?Client Site [facility] - Night ?Provider [place], Idamae Schuller - MD ?Contact Type Call ?Who Is Calling Patient / Member / Family / Caregiver ?Caller Name Jesenia Spera ?Caller Phone Number [phone] ?Call Type Message Only Information Provided ?Reason for Call Returning a Call from the Office ?Initial Comment Caller states, returning a call from office. ?Disp. Time Disposition Final User ?11/19/2021 5:13:25 PM General Information Provided Yes Merrily Pew ?Call Closed By: Merrily Pew ?Transaction Date/Time: 11/19/2021 5:12:12 PM (ET ?

## 2021-11-22 ENCOUNTER — Telehealth: Payer: Self-pay | Admitting: Genetic Counselor

## 2021-11-22 NOTE — Telephone Encounter (Signed)
Scheduled appt per 3/22 referral. Pt is aware of appt date and time. Pt is aware to arrive 15 mins prior to appt time and to bring and updated insurance card. Pt is aware of appt location.   ?

## 2021-11-29 ENCOUNTER — Ambulatory Visit
Admission: RE | Admit: 2021-11-29 | Discharge: 2021-11-29 | Disposition: A | Discharge status: Home / Self Care | Payer: Federal, State, Local not specified - PPO | Source: Ambulatory Visit | Attending: Family Medicine | Admitting: Family Medicine

## 2021-11-29 ENCOUNTER — Ambulatory Visit: Payer: Federal, State, Local not specified - PPO

## 2021-11-29 DIAGNOSIS — R922 Inconclusive mammogram: Secondary | ICD-10-CM | POA: Diagnosis not present

## 2021-11-29 DIAGNOSIS — R928 Other abnormal and inconclusive findings on diagnostic imaging of breast: Secondary | ICD-10-CM

## 2021-12-25 ENCOUNTER — Other Ambulatory Visit: Payer: Self-pay | Admitting: Family Medicine

## 2022-01-02 ENCOUNTER — Inpatient Hospital Stay: Payer: Federal, State, Local not specified - PPO

## 2022-01-02 ENCOUNTER — Inpatient Hospital Stay: Payer: Federal, State, Local not specified - PPO | Admitting: Genetic Counselor

## 2022-02-06 ENCOUNTER — Inpatient Hospital Stay: Payer: Federal, State, Local not specified - PPO

## 2022-02-06 ENCOUNTER — Inpatient Hospital Stay: Payer: Federal, State, Local not specified - PPO | Admitting: Genetic Counselor

## 2022-03-07 ENCOUNTER — Other Ambulatory Visit: Payer: Self-pay | Admitting: Family Medicine

## 2022-03-10 ENCOUNTER — Other Ambulatory Visit: Payer: Self-pay

## 2022-03-10 ENCOUNTER — Inpatient Hospital Stay (HOSPITAL_BASED_OUTPATIENT_CLINIC_OR_DEPARTMENT_OTHER): Payer: Federal, State, Local not specified - PPO | Admitting: Licensed Clinical Social Worker

## 2022-03-10 ENCOUNTER — Encounter: Payer: Self-pay | Admitting: Licensed Clinical Social Worker

## 2022-03-10 ENCOUNTER — Inpatient Hospital Stay: Payer: Federal, State, Local not specified - PPO | Attending: Genetic Counselor

## 2022-03-10 DIAGNOSIS — Z8042 Family history of malignant neoplasm of prostate: Secondary | ICD-10-CM | POA: Diagnosis not present

## 2022-03-10 DIAGNOSIS — Z803 Family history of malignant neoplasm of breast: Secondary | ICD-10-CM | POA: Diagnosis not present

## 2022-03-10 DIAGNOSIS — Z8041 Family history of malignant neoplasm of ovary: Secondary | ICD-10-CM

## 2022-03-10 LAB — GENETIC SCREENING ORDER

## 2022-03-10 NOTE — Progress Notes (Signed)
REFERRING PROVIDER: Abner Greenspan, MD [place],  [place] [address]  PRIMARY PROVIDER:  Abner Greenspan, MD  PRIMARY REASON FOR VISIT:  1. Family history of breast cancer   2. Family history of prostate cancer   3. Family history of ovarian cancer     HISTORY OF PRESENT ILLNESS:   [name], a 36 y.o. female, was seen for a [facility] cancer genetics consultation at the request of Dr. Glori Bickers due to a family history of cancer.  Ashlee Perry presents to clinic today to discuss the possibility of a hereditary predisposition to cancer, genetic testing, and to further clarify her future cancer risks, as well as potential cancer risks for family members.   Ashlee Perry is a 36 y.o. female with no personal history of cancer.    CANCER HISTORY:  Oncology History   No history exists.     RISK FACTORS:  Menarche was at age 28.  OCP use for approximately 0 years.  Ovaries intact: yes.  Hysterectomy: no.  Menopausal status: premenopausal.  HRT use: 0 years. Colonoscopy: no; not examined. Mammogram within the last year: yes. Number of breast biopsies: 0.  Past Medical History:  Diagnosis Date   History of gastroesophageal reflux (GERD)    Migraine     Past Surgical History:  Procedure Laterality Date   WISDOM TOOTH EXTRACTION  2006    FAMILY HISTORY:  We obtained a detailed, 4-generation family history.  Significant diagnoses are listed below: Family History  Problem Relation Age of Onset   Breast cancer Mother 67   Hypertension Mother    Hypertension Father    Breast cancer Maternal Aunt    Breast cancer Maternal Aunt    Breast cancer Maternal Aunt    Prostate cancer Maternal Uncle    Prostate cancer Paternal Uncle    Diabetes Maternal Grandmother    Breast cancer Maternal Grandmother    Arthritis Maternal Grandfather    Kidney failure Maternal Grandfather    Ovarian cancer Paternal Grandmother        d. 57s   Prostate cancer Paternal Grandfather 64        metastatic   Ashlee Perry has 1 brother, 77, no cancer.  Ashlee Perry mother had breast cancer at 8 and is living at 42. She had genetic testing that was reportedly negative but patient is not sure how long ago this was. Patient has 7 maternal aunts, 5 uncles. Three aunts had breast cancer. One uncle had prostate cancer, and another uncle had bladder cancer and passed from it. Maternal grandmother also had breast cancer, passed in her 61s.  Ashlee Perry father is living at 56. No cancer in paternal aunts/uncles/cousins. Grandmother had ovarian cancer and passed in her 15s. Grandfather had metastatic prostate cancer and passed at 37. His brother also had prostate cancer.  Ashlee Perry is aware of previous family history of genetic testing for hereditary cancer risks. There is no reported Ashkenazi Jewish ancestry. There is no known consanguinity.  GENETIC COUNSELING ASSESSMENT: Ms. Leis is a 36 y.o. female with a family history of breast and ovarian cancer which is somewhat suggestive of a hereditary cancer syndrome and predisposition to cancer. We, therefore, discussed and recommended the following at today's visit.   DISCUSSION: We discussed that approximately 10% of breast cancer is hereditary, 10-20% of ovarian cancer is hereditary. Most cases of hereditary breast/ovarian cancer are associated with BRCA1/BRCA2 genes, although there are other genes associated with hereditary cancer as  well. Cancers and risks are gene specific.  She meets criteria for testing on both sides of her family. We discussed that testing is beneficial for several reasons including knowing about cancer risks, identifying potential screening and risk-reduction options that may be appropriate, and to understand if other family members could be at risk for cancer and allow them to undergo genetic testing.   We reviewed the characteristics, features and inheritance patterns of hereditary cancer syndromes. We also discussed genetic  testing, including the appropriate family members to test, the process of testing, insurance coverage and turn-around-time for results. We discussed the implications of a negative, positive and/or variant of uncertain significant result. We recommended Ashlee Perry pursue genetic testing for the [employer] CancerNext-Expanded+RNA gene panel.   Based on Ashlee Perry's family history of cancer, she meets medical criteria for genetic testing. Despite that she meets criteria, she may still have an out of pocket cost. We discussed that if her out of pocket cost for testing is over $100, the laboratory will call and confirm whether she wants to proceed with testing.  If the out of pocket cost of testing is less than $100 she will be billed by the genetic testing laboratory.   PLAN: After considering the risks, benefits, and limitations, Ashlee Perry provided informed consent to pursue genetic testing and the blood sample was sent to [facility] for analysis of the CancerNext-Expanded+RNA panel. Results should be available within approximately 2-3 weeks' time, at which point they will be disclosed by telephone to Ms. Bueso, as will any additional recommendations warranted by these results. [name] will receive a summary of her genetic counseling visit and a copy of her results once available. This information will also be available in Epic.   Ashlee Perry questions were answered to her satisfaction today. Our contact information was provided should additional questions or concerns arise. Thank you for the referral and allowing [place] to share in the care of your patient.   [name], MS, [facility] Genetic Counselor [email or web address] Phone: [phone]  The patient was seen for a total of 30 minutes in face-to-face genetic counseling.  Dr. Grayland Ormond was available for discussion regarding this case.   _______________________________________________________________________ For Office Staff:  Number of  people involved in session: 1 Was an Intern/ student involved with case: no

## 2022-03-26 ENCOUNTER — Ambulatory Visit: Payer: Self-pay | Admitting: Licensed Clinical Social Worker

## 2022-03-26 ENCOUNTER — Telehealth: Payer: Self-pay | Admitting: Licensed Clinical Social Worker

## 2022-03-26 ENCOUNTER — Encounter: Payer: Self-pay | Admitting: Licensed Clinical Social Worker

## 2022-03-26 DIAGNOSIS — Z1379 Encounter for other screening for genetic and chromosomal anomalies: Secondary | ICD-10-CM | POA: Insufficient documentation

## 2022-03-26 NOTE — Telephone Encounter (Signed)
Revealed negative genetic testing.  Revealed that a VUS in [facility] was identified. This normal result is reassuring.  It is unlikely that there is an increased risk of cancer due to a mutation in one of these genes.  However, genetic testing is not perfect, and cannot definitively rule out a hereditary cause.  It will be important for her to keep in contact with genetics to learn if any additional testing may be needed in the future.

## 2022-03-26 NOTE — Progress Notes (Signed)
HPI:  Ashlee Perry was previously seen in the [facility] clinic due to a family history of breast cancer and concerns regarding a hereditary predisposition to cancer. Please refer to our prior cancer genetics clinic note for more information regarding our discussion, assessment and recommendations, at the time. Ashlee Perry recent genetic test results were disclosed to her, as were recommendations warranted by these results. These results and recommendations are discussed in more detail below.  CANCER HISTORY:  Oncology History   No history exists.    FAMILY HISTORY:  We obtained a detailed, 4-generation family history.  Significant diagnoses are listed below: Family History  Problem Relation Age of Onset   Breast cancer Mother 42   Hypertension Mother    Hypertension Father    Breast cancer Maternal Aunt    Breast cancer Maternal Aunt    Breast cancer Maternal Aunt    Prostate cancer Maternal Uncle    Prostate cancer Paternal Uncle    Diabetes Maternal Grandmother    Breast cancer Maternal Grandmother    Arthritis Maternal Grandfather    Kidney failure Maternal Grandfather    Ovarian cancer Paternal Grandmother        d. 60s   Prostate cancer Paternal Grandfather 41       metastatic   Ashlee Perry has 1 brother, 4, no cancer.   Ashlee Perry mother had breast cancer at 95 and is living at 90. She had genetic testing that was reportedly negative but patient is not sure how long ago this was. Patient has 7 maternal aunts, 5 uncles. Three aunts had breast cancer. One uncle had prostate cancer, and another uncle had bladder cancer and passed from it. Maternal grandmother also had breast cancer, passed in her 31s.   Ashlee Perry father is living at 33. No cancer in paternal aunts/uncles/cousins. Grandmother had ovarian cancer and passed in her 63s. Grandfather had metastatic prostate cancer and passed at 66. His brother also had prostate cancer.   Ashlee Perry is aware of previous  family history of genetic testing for hereditary cancer risks. There is no reported Ashkenazi Jewish ancestry. There is no known consanguinity.   GENETIC TEST RESULTS: Genetic testing reported out on 03/25/2022 through the [facility] CancerNext-Expanded+RNA cancer panel found no pathogenic mutations.   The CancerNext-Expanded + RNAinsight gene panel offered by [employer] and includes sequencing and rearrangement analysis for the following 77 genes: IP, ALK, APC*, ATM*, AXIN2, BAP1, BARD1, BLM, BMPR1A, BRCA1*, BRCA2*, BRIP1*, CDC73, CDH1*,CDK4, CDKN1B, CDKN2A, CHEK2*, CTNNA1, DICER1, FANCC, FH, FLCN, GALNT12, KIF1B, LZTR1, MAX, MEN1, MET, MLH1*, MSH2*, MSH3, MSH6*, MUTYH*, NBN, NF1*, NF2, NTHL1, PALB2*, PHOX2B, PMS2*, POT1, PRKAR1A, PTCH1, PTEN*, RAD51C*, RAD51D*,RB1, RECQL, RET, SDHA, SDHAF2, SDHB, SDHC, SDHD, SMAD4, SMARCA4, SMARCB1, SMARCE1, STK11, SUFU, TMEM127, TP53*,TSC1, TSC2, VHL and XRCC2 (sequencing and deletion/duplication); EGFR, EGLN1, HOXB13, KIT, MITF, PDGFRA, POLD1 and POLE (sequencing only); EPCAM and GREM1 (deletion/duplication only).   The test report has been scanned into EPIC and is located under the Molecular Pathology section of the Results Review tab.  A portion of the result report is included below for reference.     We discussed that because current genetic testing is not perfect, it is possible there may be a gene mutation in one of these genes that current testing cannot detect, but that chance is small.  There could be another gene that has not yet been discovered, or that we have not yet tested, that is responsible for the cancer diagnoses in the family.  It is also possible there is a hereditary cause for the cancer in the family that Ashlee Perry did not inherit and therefore was not identified in her testing.  Therefore, it is important to remain in touch with cancer genetics in the future so that we can continue to offer Ashlee Perry the most up to date genetic testing.    Genetic testing did identify a variant of uncertain significance (VUS) in the [facility] gene called [id number].  At this time, it is unknown if this variant is associated with increased cancer risk or if this is a normal finding, but most variants such as this get reclassified to being inconsequential. It should not be used to make medical management decisions. With time, we suspect the lab will determine the significance of this variant, if any. If we do learn more about it we will try to contact Ashlee Perry to discuss it further. However, it is important to stay in touch with [place] periodically and keep the address and phone number up to date.  ADDITIONAL GENETIC TESTING: We discussed with Ashlee Perry that her genetic testing was fairly extensive.  If there are genes identified to increase cancer risk that can be analyzed in the future, we would be happy to discuss and coordinate this testing at that time.    CANCER SCREENING RECOMMENDATIONS: Ashlee Perry test result is considered negative (normal).  This means that we have not identified a hereditary cause for her  family history of cancer at this time.   While reassuring, this does not definitively rule out a hereditary predisposition to cancer. It is still possible that there could be genetic mutations that are undetectable by current technology. There could be genetic mutations in genes that have not been tested or identified to increase cancer risk.  Therefore, it is recommended she continue to follow the cancer management and screening guidelines provided by her primary healthcare provider.   An individual's cancer risk and medical management are not determined by genetic test results alone. Overall cancer risk assessment incorporates additional factors, including personal medical history, family history, and any available genetic information that may result in a personalized plan for cancer prevention and surveillance.  Based on Ashlee Perry's personal and  family history of cancer as well as her genetic test results, risk model Ashlee Perry was used to estimate her risk of developing breast cancer. This estimates her lifetime risk of developing breast cancer to be approximately 29%.  The patient's lifetime breast cancer risk is a preliminary estimate based on available information using one of several models endorsed by the [employer]  (NCCN). This risk estimate can change over time and may be repeated to reflect new information in her personal or family history in the future.  Ashlee Perry has been determined to be at high risk for breast cancer. her Tyrer-Cuzick risk score is 29%.  For women with a greater than 20% lifetime risk of breast cancer, the [employer]  (NCCN) recommends the following:   1.      Clinical encounter every 6-12 months to begin when identified as being at increased risk, but not before age 36  2.      Annual mammograms. Tomosynthesis is recommended starting 10 years earlier than the youngest breast cancer diagnosis in the family or at age 81 (whichever comes first), but not before age 57    3.      Annual breast MRI starting 10 years earlier than the youngest breast cancer  diagnosis in the family or at age 27 (whichever comes first), but not before age 64   A referral was placed to the high risk breast clinic today.     RECOMMENDATIONS FOR FAMILY MEMBERS:  Relatives in this family might be at some increased risk of developing cancer, over the general population risk, simply due to the family history of cancer.  We recommended female relatives in this family have a yearly mammogram beginning at age 63, or 65 years younger than the earliest onset of cancer, an annual clinical breast exam, and perform monthly breast self-exams. Female relatives in this family should also have a gynecological exam as recommended by their primary provider.  All family members should be referred for  colonoscopy starting at age 24.    It is also possible there is a hereditary cause for the cancer in Ashlee Perry's family that she did not inherit and therefore was not identified in her.  Based on Ashlee Perry's family history, we recommended maternal relatives have genetic counseling and testing. Ashlee Perry will let us know if we can be of any assistance in coordinating genetic counseling and/or testing for these family members.  FOLLOW-UP: Lastly, we discussed with Ashlee Perry that cancer genetics is a rapidly advancing field and it is possible that new genetic tests will be appropriate for her and/or her family members in the future. We encouraged her to remain in contact with cancer genetics on an annual basis so we can update her personal and family histories and let her know of advances in cancer genetics that may benefit this family.   Our contact number was provided. Ashlee Perry. Broccoli questions were answered to her satisfaction, and she knows she is welcome to call us at anytime with additional questions or concerns.   [name], Ashlee Perry, [facility] Genetic Counselor [email or web address] Phone: [phone]

## 2022-08-11 ENCOUNTER — Other Ambulatory Visit: Payer: Self-pay | Admitting: Family Medicine

## 2022-10-06 DIAGNOSIS — J101 Influenza due to other identified influenza virus with other respiratory manifestations: Secondary | ICD-10-CM | POA: Diagnosis not present

## 2022-10-06 DIAGNOSIS — R6883 Chills (without fever): Secondary | ICD-10-CM | POA: Diagnosis not present

## 2022-10-31 ENCOUNTER — Other Ambulatory Visit: Payer: Self-pay | Admitting: Family Medicine

## 2022-10-31 DIAGNOSIS — Z1231 Encounter for screening mammogram for malignant neoplasm of breast: Secondary | ICD-10-CM

## 2022-11-04 ENCOUNTER — Encounter: Payer: Self-pay | Admitting: Pharmacist

## 2022-11-07 ENCOUNTER — Other Ambulatory Visit: Payer: Self-pay | Admitting: Family Medicine

## 2022-11-13 ENCOUNTER — Telehealth: Payer: Self-pay | Admitting: Family Medicine

## 2022-11-13 DIAGNOSIS — R7303 Prediabetes: Secondary | ICD-10-CM

## 2022-11-13 DIAGNOSIS — E559 Vitamin D deficiency, unspecified: Secondary | ICD-10-CM

## 2022-11-13 DIAGNOSIS — I10 Essential (primary) hypertension: Secondary | ICD-10-CM

## 2022-11-13 NOTE — Telephone Encounter (Signed)
-----   Message from Velna Hatchet, RT sent at 10/28/2022  8:14 AM EST ----- Regarding: [name] is scheduled for cpx, please order future labs.  Thanks, Anda Kraft

## 2022-11-14 ENCOUNTER — Other Ambulatory Visit (INDEPENDENT_AMBULATORY_CARE_PROVIDER_SITE_OTHER): Payer: Federal, State, Local not specified - PPO

## 2022-11-14 DIAGNOSIS — I10 Essential (primary) hypertension: Secondary | ICD-10-CM

## 2022-11-14 DIAGNOSIS — R7303 Prediabetes: Secondary | ICD-10-CM

## 2022-11-14 DIAGNOSIS — E559 Vitamin D deficiency, unspecified: Secondary | ICD-10-CM | POA: Diagnosis not present

## 2022-11-14 LAB — CBC WITH DIFFERENTIAL/PLATELET
Basophils Absolute: 0 10*3/uL (ref 0.0–0.1)
Basophils Relative: 0.6 % (ref 0.0–3.0)
Eosinophils Absolute: 0.1 10*3/uL (ref 0.0–0.7)
Eosinophils Relative: 1.1 % (ref 0.0–5.0)
HCT: 34.6 % — ABNORMAL LOW (ref 36.0–46.0)
Hemoglobin: 11.1 g/dL — ABNORMAL LOW (ref 12.0–15.0)
Lymphocytes Relative: 35.9 % (ref 12.0–46.0)
Lymphs Abs: 3 10*3/uL (ref 0.7–4.0)
MCHC: 32.2 g/dL (ref 30.0–36.0)
MCV: 77 fl — ABNORMAL LOW (ref 78.0–100.0)
Monocytes Absolute: 0.5 10*3/uL (ref 0.1–1.0)
Monocytes Relative: 6.5 % (ref 3.0–12.0)
Neutro Abs: 4.6 10*3/uL (ref 1.4–7.7)
Neutrophils Relative %: 55.9 % (ref 43.0–77.0)
Platelets: 407 10*3/uL — ABNORMAL HIGH (ref 150.0–400.0)
RBC: 4.5 Mil/uL (ref 3.87–5.11)
RDW: 15.4 % (ref 11.5–15.5)
WBC: 8.3 10*3/uL (ref 4.0–10.5)

## 2022-11-14 LAB — COMPREHENSIVE METABOLIC PANEL
ALT: 10 U/L (ref 0–35)
AST: 21 U/L (ref 0–37)
Albumin: 4.1 g/dL (ref 3.5–5.2)
Alkaline Phosphatase: 60 U/L (ref 39–117)
BUN: 17 mg/dL (ref 6–23)
CO2: 29 mEq/L (ref 19–32)
Calcium: 9.9 mg/dL (ref 8.4–10.5)
Chloride: 101 mEq/L (ref 96–112)
Creatinine, Ser: 0.78 mg/dL (ref 0.40–1.20)
GFR: 95.84 mL/min (ref >60.00)
Glucose, Bld: 104 mg/dL — ABNORMAL HIGH (ref 70–99)
Potassium: 4.3 mEq/L (ref 3.5–5.1)
Sodium: 138 mEq/L (ref 135–145)
Total Bilirubin: 0.3 mg/dL (ref 0.2–1.2)
Total Protein: 7.3 g/dL (ref 6.0–8.3)

## 2022-11-14 LAB — LIPID PANEL
Cholesterol: 200 mg/dL (ref 0–200)
HDL: 58.3 mg/dL (ref >39.00)
LDL Cholesterol: 119 mg/dL — ABNORMAL HIGH (ref 0–99)
NonHDL: 141.46
Total CHOL/HDL Ratio: 3
Triglycerides: 114 mg/dL (ref 0.0–149.0)
VLDL: 22.8 mg/dL (ref 0.0–40.0)

## 2022-11-14 LAB — HEMOGLOBIN A1C: Hgb A1c MFr Bld: 6.2 % (ref 4.6–6.5)

## 2022-11-14 LAB — TSH: TSH: 3.54 u[IU]/mL (ref 0.35–5.50)

## 2022-11-14 LAB — VITAMIN D 25 HYDROXY (VIT D DEFICIENCY, FRACTURES): VITD: 31.34 ng/mL (ref 30.00–100.00)

## 2022-11-15 ENCOUNTER — Other Ambulatory Visit: Payer: Self-pay | Admitting: Family Medicine

## 2022-11-21 ENCOUNTER — Encounter: Payer: Self-pay | Admitting: Family Medicine

## 2022-11-21 ENCOUNTER — Ambulatory Visit (INDEPENDENT_AMBULATORY_CARE_PROVIDER_SITE_OTHER): Payer: Federal, State, Local not specified - PPO | Admitting: Family Medicine

## 2022-11-21 VITALS — BP 106/68 | HR 85 | Temp 97.5°F | Ht 64.0 in | Wt 184.1 lb

## 2022-11-21 DIAGNOSIS — Z0000 Encounter for general adult medical examination without abnormal findings: Secondary | ICD-10-CM | POA: Diagnosis not present

## 2022-11-21 DIAGNOSIS — E559 Vitamin D deficiency, unspecified: Secondary | ICD-10-CM

## 2022-11-21 DIAGNOSIS — I10 Essential (primary) hypertension: Secondary | ICD-10-CM | POA: Diagnosis not present

## 2022-11-21 DIAGNOSIS — R7303 Prediabetes: Secondary | ICD-10-CM

## 2022-11-21 DIAGNOSIS — Z79899 Other long term (current) drug therapy: Secondary | ICD-10-CM

## 2022-11-21 DIAGNOSIS — E669 Obesity, unspecified: Secondary | ICD-10-CM

## 2022-11-21 DIAGNOSIS — K219 Gastro-esophageal reflux disease without esophagitis: Secondary | ICD-10-CM

## 2022-11-21 MED ORDER — OMEPRAZOLE 20 MG PO CPDR: 20.0000 mg | DELAYED_RELEASE_CAPSULE | Freq: Two times a day (BID) | ORAL | 3 refills | Status: DC

## 2022-11-21 MED ORDER — CHLORTHALIDONE 25 MG PO TABS: 12.5000 mg | ORAL_TABLET | Freq: Every day | ORAL | 3 refills | Status: DC

## 2022-11-21 MED ORDER — TOPIRAMATE 25 MG PO TABS: 25.0000 mg | ORAL_TABLET | Freq: Two times a day (BID) | ORAL | 3 refills | Status: AC

## 2022-11-21 NOTE — Patient Instructions (Addendum)
Thnik about what you can add for exercise  Add some strength training to your routine, this is important for bone and brain health and can reduce your risk of falls and help your body use insulin properly and regulate weight  Light weights, exercise bands , and internet videos are a good way to start  Yoga (chair or regular), machines , floor exercises or a gym with machines are also good options    Try to get most of your carbohydrates from produce (with the exception of white potatoes)  Eat less bread/pasta/rice/snack foods/cereals/sweets and other items from the middle of the grocery store (processed carbs)   I want you to get a little more vitamin D You can go up to 4000 iu daily   For cholesterol Avoid red meat/ fried foods/ egg yolks/ fatty breakfast meats/ butter, cheese and high fat dairy/ and shellfish    Take care of yourself

## 2022-11-21 NOTE — Progress Notes (Unsigned)
Subjective:    Patient ID: Ashlee Perry, female    DOB: July 29, 1986, 37 y.o.   MRN: [id number]  HPI Here for health maintenance exam and to review chronic medical problems   Wt Readings from Last 3 Encounters:  11/21/22 184 lb 2 oz (83.5 kg)  11/18/21 183 lb 6.4 oz (83.2 kg)  10/31/21 181 lb (82.1 kg)   31.12 kg/m  Vitals:   11/21/22 1131  BP: 106/68  Pulse: 85  Temp: (!) 97.5 F (36.4 C)  SpO2: 98%    Working long hours- 2 jobs  Worse diet and exercise  Hard to find time   Eating less sweets  Drinks lots of water  Got away from sodas  More apples /fruit   Loves some lean protein     Immunization History  Administered Date(s) Administered   Tdap 10/18/2014   Health Maintenance Due  Topic Date Due   COVID-19 Vaccine (1) Never done   Hepatitis C Screening  Never done   INFLUENZA VACCINE  Never done   MAMMOGRAM  11/08/2022   PAP SMEAR-Modifier  09/27/2022   Gyn care -does here  Had genetics test with family h/o breast cancer   Pap nl 09/2019 with neg HPV  No new partners  No gyn concerns  Period are regular   Periods make her tired     Mammogram  Did have one in 10/2021 - asymmetries noted and has short term f/u  Next mammogram is scheduled on 12/18/22 No lumps on self exam    HTN bp is stable today  No cp or palpitations or headaches or edema  No side effects to medicines  BP Readings from Last 3 Encounters:  11/21/22 106/68  11/18/21 126/90  10/31/21 124/88    Chlorthalidone 12.5 mg daily   GERD Omeprazole 20 mg qd (occ needs it bid)   Lab Results  Component Value Date   VITAMINB12 1,141 (H) 11/18/2021    Vit D def Takes 2000 iu daily   Last vitamin D Lab Results  Component Value Date   VD25OH 31.34 11/14/2022   Prediabetes Lab Results  Component Value Date   HGBA1C 6.2 11/14/2022  This is stable from last year    Iron def Lab Results  Component Value Date   WBC 8.3 11/14/2022   HGB 11.1 (L) 11/14/2022   HCT 34.6  (L) 11/14/2022   MCV 77.0 (L) 11/14/2022   PLT 407.0 (H) 11/14/2022   Lab Results  Component Value Date   IRON 82 11/18/2021   FERRITIN 9.6 (L) 09/28/2019   Takes iron before period and during period   Iron supplementation   Cholesterol Lab Results  Component Value Date   CHOL 200 11/14/2022   CHOL 176 11/18/2021   CHOL 191 10/24/2020   Lab Results  Component Value Date   HDL 58.30 11/14/2022   HDL 48.70 11/18/2021   HDL 56.30 10/24/2020   Lab Results  Component Value Date   LDLCALC 119 (H) 11/14/2022   LDLCALC 101 (H) 11/18/2021   LDLCALC 107 (H) 10/24/2020   Lab Results  Component Value Date   TRIG 114.0 11/14/2022   TRIG 129.0 11/18/2021   TRIG 140.0 10/24/2020   Lab Results  Component Value Date   CHOLHDL 3 11/14/2022   CHOLHDL 4 11/18/2021   CHOLHDL 3 10/24/2020   No results found for: "LDLDIRECT" Stopped margarine    Has sweet intake down / a lot less   Review of Systems  Objective:   Physical Exam        Assessment & Plan:

## 2022-11-23 NOTE — Assessment & Plan Note (Signed)
Reviewed health habits including diet and exercise and skin cancer prevention Reviewed appropriate screening tests for age  Also reviewed health mt list, fam hx and immunization status , as well as social and family history   See HPI Labs reviewed  Utd pap 2021 with neg HPV , does not need contraception  Mammogram is scheduled for 12/18/22 to follow some asymmetries Of note- also did genetic testing in light of fam hx  No change on exam  Enc wt bearing/strength building exercise

## 2022-11-23 NOTE — Assessment & Plan Note (Signed)
Lab Results  Component Value Date   HGBA1C 6.2 11/14/2022   stable Reviewed low glycemic diet habits disc imp of low glycemic diet and wt loss to prevent DM2

## 2022-11-23 NOTE — Assessment & Plan Note (Signed)
bp in fair control at this time  BP Readings from Last 1 Encounters:  11/21/22 106/68   No changes needed Forgot med today so usually diastolic is below 90 Most recent labs reviewed  Disc lifstyle change with low sodium diet and exercise  Plan to continue chlorthalidone 12.5 mg daily

## 2022-11-23 NOTE — Assessment & Plan Note (Signed)
Omeprazole 20 mg -used to take bid and now usually daily which is good Enc to use as needed  Watch diet for triggers

## 2022-11-23 NOTE — Assessment & Plan Note (Signed)
Discussed how this problem influences overall health and the risks it imposes  Reviewed plan for weight loss with lower calorie diet (via better food choices and also portion control or program like weight watchers) and exercise building up to or more than 30 minutes 5 days per week including some aerobic activity    

## 2022-11-23 NOTE — Assessment & Plan Note (Signed)
Now taking half as much omeprazole

## 2022-11-23 NOTE — Assessment & Plan Note (Signed)
Last vitamin D Lab Results  Component Value Date   VD25OH 31.34 11/14/2022   This is low normal Inst to inc D3 from 2000 to 4000 iu daily  Disc imp to bone and overall health

## 2022-12-18 ENCOUNTER — Ambulatory Visit
Admission: RE | Admit: 2022-12-18 | Discharge: 2022-12-18 | Disposition: A | Discharge status: Home / Self Care | Payer: Federal, State, Local not specified - PPO | Source: Ambulatory Visit | Attending: Family Medicine | Admitting: Family Medicine

## 2022-12-18 DIAGNOSIS — Z1231 Encounter for screening mammogram for malignant neoplasm of breast: Secondary | ICD-10-CM

## 2023-01-08 ENCOUNTER — Telehealth: Payer: Self-pay | Admitting: Family Medicine

## 2023-01-08 NOTE — Telephone Encounter (Signed)
Patient dropped off document FMLA, to be filled out by provider. Patient requested to send it via Call Patient to pick up within 5-days. Document is located in providers tray at front office.Please advise at Mobile [phone] (mobile)

## 2023-01-08 NOTE — Telephone Encounter (Signed)
Placed form in Dr. Royden Purl box.

## 2023-01-14 NOTE — Telephone Encounter (Signed)
Done and in IN box 

## 2023-01-14 NOTE — Telephone Encounter (Signed)
Form faxed, I kept a copy and copy sent to scanning. Pt notified and copy also placed at the front for pt to pick up

## 2023-01-29 NOTE — Telephone Encounter (Signed)
Patient came by and dropped office incomplete fmla paperwork.She stated that her employer is saying that additional information is needed. Paperwork left in [employer] up front. Patient said please call her with any questions.

## 2023-02-02 NOTE — Telephone Encounter (Signed)
Form placed in Dr. Lucretia Roers inbox for review and completion.

## 2023-02-03 NOTE — Telephone Encounter (Signed)
I don't quite understand what they need from this.  Form notes provicer must address and clarify the frequency and duration of flare ups on the certification   I noted on question 9 that episodes are likely to be 2 times per month and last approx 8 hours per episode  Do they need something more?  Thanks

## 2023-02-04 NOTE — Telephone Encounter (Signed)
Patient called back and stated that the part that says " 8 hrs a day including breaks per week" needs to be marked out on her fmla paperwork,and faxed back.

## 2023-02-04 NOTE — Telephone Encounter (Signed)
Form back in your inbox

## 2023-02-04 NOTE — Telephone Encounter (Signed)
Called HR and they can't tell me anything without pt on the phone. Called pt and she will call them directly to see what else are we suppose to put on the form and let us know

## 2023-02-05 NOTE — Telephone Encounter (Signed)
Pt notified form ready for pick-up and copy sent to scanning 

## 2023-02-05 NOTE — Telephone Encounter (Signed)
Done, back in IN box, thanks

## 2023-04-13 ENCOUNTER — Telehealth: Payer: Self-pay | Admitting: Family Medicine

## 2023-04-13 NOTE — Telephone Encounter (Signed)
Scheduled for OV weds 8/14

## 2023-04-13 NOTE — Telephone Encounter (Signed)
Thanks for letting me know  Please schedule appt in office and have her bring cuff so we can test for accuracy also

## 2023-04-13 NOTE — Telephone Encounter (Signed)
Patient contacted the office regarding blood pressure, says ever since she has taken medication chlorthalidone (HYGROTON) 25 MG tablet, the bottom number of blood pressure reading has been slightly elevated. Between 80-90 and high as 91 or 92. Says she has been taking half of a tablet as instructed, wants to know Dr. Royden Purl thoughts on this and if she needs to switch medications? Please advise patient at [phone]

## 2023-04-15 ENCOUNTER — Ambulatory Visit (INDEPENDENT_AMBULATORY_CARE_PROVIDER_SITE_OTHER): Payer: Federal, State, Local not specified - PPO | Admitting: Family Medicine

## 2023-04-15 ENCOUNTER — Encounter: Payer: Self-pay | Admitting: Family Medicine

## 2023-04-15 VITALS — BP 110/71 | HR 66 | Temp 97.8°F | Ht 64.0 in | Wt 186.2 lb

## 2023-04-15 DIAGNOSIS — N951 Menopausal and female climacteric states: Secondary | ICD-10-CM | POA: Insufficient documentation

## 2023-04-15 DIAGNOSIS — I10 Essential (primary) hypertension: Secondary | ICD-10-CM

## 2023-04-15 DIAGNOSIS — G43009 Migraine without aura, not intractable, without status migrainosus: Secondary | ICD-10-CM | POA: Diagnosis not present

## 2023-04-15 MED ORDER — CHLORTHALIDONE 25 MG PO TABS: 12.5000 mg | ORAL_TABLET | Freq: Every day | ORAL | 2 refills | Status: DC

## 2023-04-15 NOTE — Patient Instructions (Addendum)
Get back on topamax for headache prevention   If headaches don't improve let us know   Your cuff runs about 10 points high top and bottom  When you check BP subtract 10 from both top and bottom  I think your blood pressure is in good control with 1/2 pill for chlorthalidone   Keep working on eating clean   Try to get most of your carbohydrates from produce (with the exception of white potatoes) and whole grains Eat less bread/pasta/rice/snack foods/cereals/sweets and other items from the middle of the grocery store (processed carbs)       Mediterranean Diet  Why follow it? Research shows. Those who follow the Mediterranean diet have a reduced risk of heart disease  The diet is associated with a reduced incidence of Parkinson's and Alzheimer's diseases People following the diet may have longer life expectancies and lower rates of chronic diseases  The Dietary Guidelines for Americans recommends the Mediterranean diet as an eating plan to promote health and prevent disease  What Is the Mediterranean Diet?  Healthy eating plan based on typical foods and recipes of Mediterranean-style cooking The diet is primarily a plant based diet; these foods should make up a majority of meals   Starches - Plant based foods should make up a majority of meals - They are an important sources of vitamins, minerals, energy, antioxidants, and fiber - Choose whole grains, foods high in fiber and minimally processed items  - Typical grain sources include wheat, oats, barley, corn, brown rice, bulgar, farro, millet, polenta, couscous  - Various types of beans include chickpeas, lentils, fava beans, black beans, white beans   Fruits  Veggies - Large quantities of antioxidant rich fruits & veggies; 6 or more servings  - Vegetables can be eaten raw or lightly drizzled with oil and cooked  - Vegetables common to the traditional Mediterranean Diet include: artichokes, arugula, beets, broccoli, brussel sprouts,  cabbage, carrots, celery, collard greens, cucumbers, eggplant, kale, leeks, lemons, lettuce, mushrooms, okra, onions, peas, peppers, potatoes, pumpkin, radishes, rutabaga, shallots, spinach, sweet potatoes, turnips, zucchini - Fruits common to the Mediterranean Diet include: apples, apricots, avocados, cherries, clementines, dates, figs, grapefruits, grapes, melons, nectarines, oranges, peaches, pears, pomegranates, strawberries, tangerines  Fats - Replace butter and margarine with healthy oils, such as olive oil, canola oil, and tahini  - Limit nuts to no more than a handful a day  - Nuts include walnuts, almonds, pecans, pistachios, pine nuts  - Limit or avoid candied, honey roasted or heavily salted nuts - Olives are central to the [employer] - can be eaten whole or used in a variety of dishes   Meats Protein - Limiting red meat: no more than a few times a month - When eating red meat: choose lean cuts and keep the portion to the size of deck of cards - Eggs: approx. 0 to 4 times a week  - Fish and lean poultry: at least 2 a week  - Healthy protein sources include, chicken, [place], lean beef, lamb - Increase intake of seafood such as tuna, salmon, trout, mackerel, shrimp, scallops - Avoid or limit high fat processed meats such as sausage and bacon  Dairy - Include moderate amounts of low fat dairy products  - Focus on healthy dairy such as fat free yogurt, skim milk, low or reduced fat cheese - Limit dairy products higher in fat such as whole or 2% milk, cheese, ice cream  Alcohol - Moderate amounts of red wine is ok  -  No more than 5 oz daily for women (all ages) and men older than age 76  - No more than 10 oz of wine daily for men younger than 72  Other - Limit sweets and other desserts  - Use herbs and spices instead of salt to flavor foods  - Herbs and spices common to the traditional Mediterranean Diet include: basil, bay leaves, chives, cloves, cumin, fennel, garlic, lavender,  marjoram, mint, oregano, parsley, pepper, rosemary, sage, savory, sumac, tarragon, thyme   It's not just a diet, it's a lifestyle:  The Mediterranean diet includes lifestyle factors typical of those in the region  Foods, drinks and meals are best eaten with others and savored Daily physical activity is important for overall good health This could be strenuous exercise like running and aerobics This could also be more leisurely activities such as walking, housework, yard-work, or taking the stairs Moderation is the key; a balanced and healthy diet accommodates most foods and drinks Consider portion sizes and frequency of consumption of certain foods   Meal Ideas & Options:  Breakfast:  Whole wheat toast or whole wheat English muffins with peanut butter & hard boiled egg Steel cut oats topped with apples & cinnamon and skim milk  Fresh fruit: banana, strawberries, melon, berries, peaches  Smoothies: strawberries, bananas, greek yogurt, peanut butter Low fat greek yogurt with blueberries and granola  Egg white omelet with spinach and mushrooms Breakfast couscous: whole wheat couscous, apricots, skim milk, cranberries  Sandwiches:  Hummus and grilled vegetables (peppers, zucchini, squash) on whole wheat bread   Grilled chicken on whole wheat pita with lettuce, tomatoes, cucumbers or tzatziki  [place] salad on whole wheat bread: tuna salad made with greek yogurt, olives, red peppers, capers, green onions Garlic rosemary lamb pita: lamb sauted with garlic, rosemary, salt & pepper; add lettuce, cucumber, greek yogurt to pita - flavor with lemon juice and black pepper  Seafood:  Mediterranean grilled salmon, seasoned with garlic, basil, parsley, lemon juice and black pepper Shrimp, lemon, and spinach whole-grain pasta salad made with low fat greek yogurt  Seared scallops with lemon orzo  Seared tuna steaks seasoned salt, pepper, coriander topped with tomato mixture of olives, tomatoes, olive oil,  minced garlic, parsley, green onions and cappers  Meats:  Herbed greek chicken salad with kalamata olives, cucumber, feta  Red bell peppers stuffed with spinach, bulgur, lean ground beef (or lentils) & topped with feta   Kebabs: skewers of chicken, tomatoes, onions, zucchini, squash  [place] burgers: made with red onions, mint, dill, lemon juice, feta cheese topped with roasted red peppers Vegetarian Cucumber salad: cucumbers, artichoke hearts, celery, red onion, feta cheese, tossed in olive oil & lemon juice  Hummus and whole grain pita points with a greek salad (lettuce, tomato, feta, olives, cucumbers, red onion) Lentil soup with celery, carrots made with vegetable broth, garlic, salt and pepper  Tabouli salad: parsley, bulgur, mint, scallions, cucumbers, tomato, radishes, lemon juice, olive oil, salt and pepper.

## 2023-04-15 NOTE — Assessment & Plan Note (Signed)
BP: 110/71  Pt's cuff runs approx 10 points high for systolic and diastolic  Will continue to monitor  Would not increase med based on this Encouraged to continue chlorthalidone 12.5 mg daily  Also encouraged lifestyle change Last labs reviewed

## 2023-04-15 NOTE — Assessment & Plan Note (Signed)
Menses are changing  Lighter but longer May be age/perimenopausal change (more heat intol and irritable) Could also be stress rlated Will continue to follow  Handout given

## 2023-04-15 NOTE — Progress Notes (Signed)
Subjective:    Patient ID: Ashlee Perry, female    DOB: [birth year], 37 y.o.   MRN: [id number]  HPI  Wt Readings from Last 3 Encounters:  04/15/23 186 lb 3.2 oz (84.5 kg)  11/21/22 184 lb 2 oz (83.5 kg)  11/18/21 183 lb 6.4 oz (83.2 kg)   31.96 kg/m  Vitals:   04/15/23 1110 04/15/23 1122  BP: 115/81 110/71  Pulse:    Temp:    SpO2:       Pt presents for follow up of HTN  Also migraines   Pt called this week noting that diastolic has been in low 90s at times  Taking chlorthalidone 25 mg 1/2 tablet   BP Readings from Last 3 Encounters:  04/15/23 110/71  11/21/22 106/68  11/18/21 126/90   Pulse Readings from Last 3 Encounters:  04/15/23 66  11/21/22 85  11/18/21 82    Patient's cuff 120/82    Gets headaches with elevated blood pressure and with periods  Right eye is where she gets the headaches   Most migraines are located around right eye  No vision change   Stopped topamax   No chance she could be pregnant   Uncle passed in May- emotionally hard also    Lab Results  Component Value Date   NA 138 11/14/2022   K 4.3 11/14/2022   CO2 29 11/14/2022   GLUCOSE 104 (H) 11/14/2022   BUN 17 11/14/2022   CREATININE 0.78 11/14/2022   CALCIUM 9.9 11/14/2022   GFR 95.84 11/14/2022   GFRNONAA >60 10/31/2021     Intolerant to lisinopril and ziac, amlodipine and verapamil SR in past   Periods are changing  Last period was lighter but longer  More irritable /hot /tired   Craves junk food   Trying to eat more clean - fish/ chicken  Avoids fried foods       Patient Active Problem List   Diagnosis Date Noted   Perimenopause 04/15/2023   Genetic testing 03/26/2022   Current use of proton pump inhibitor 11/18/2021   Benign essential HTN 10/24/2020   Hair loss 10/24/2020   Iron deficiency anemia 09/20/2018   Migraine headache without aura 09/20/2018   Prediabetes 09/13/2018   Obesity (BMI 30-39.9) 12/31/2016   Encounter for routine  gynecological examination 10/18/2014   Screen for STD (sexually transmitted disease) 10/18/2014   Routine general medical examination at a health care facility 10/01/2014   Vitamin D deficiency 04/24/2014   GERD (gastroesophageal reflux disease) 04/24/2014   Family history of breast cancer 04/24/2014   Past Medical History:  Diagnosis Date   History of gastroesophageal reflux (GERD)    Migraine    Past Surgical History:  Procedure Laterality Date   WISDOM TOOTH EXTRACTION  2006   Social History   Tobacco Use   Smoking status: Never   Smokeless tobacco: Never  Vaping Use   Vaping status: Some Days   Substances: Nicotine, Flavoring  Substance Use Topics   Alcohol use: Yes    Comment: wine-occ   Drug use: Yes    Types: Marijuana    Comment: 1-2 times a month   Family History  Problem Relation Age of Onset   Breast cancer Mother 16   Hypertension Mother    Hypertension Father    Breast cancer Maternal Aunt    Breast cancer Maternal Aunt    Breast cancer Maternal Aunt    Prostate cancer Maternal Uncle    Prostate cancer Paternal Uncle  Diabetes Maternal Grandmother    Breast cancer Maternal Grandmother    Arthritis Maternal Grandfather    Kidney failure Maternal Grandfather    Ovarian cancer Paternal Grandmother        d. 52s   Prostate cancer Paternal Grandfather 7       metastatic   Allergies  Allergen Reactions   Amlodipine Other (See Comments)    Migraine, light/noise sensitivity, eye/facial pain   Bisoprolol     Chest pressure    Calan Sr [Verapamil]     Constipation     Lisinopril     Hair loss and cough    Current Outpatient Medications on File Prior to Visit  Medication Sig Dispense Refill   fluticasone (FLONASE) 50 MCG/ACT nasal spray Place 1 spray into both nostrils daily. 16 g 0   ibuprofen (ADVIL) 800 MG tablet Take 1 tablet (800 mg total) by mouth every 8 (eight) hours as needed. 21 tablet 0   Multiple Vitamins-Minerals (ALIVE WOMENS GUMMY  PO) Take 1 tablet by mouth daily.     omeprazole (PRILOSEC) 20 MG capsule Take 1 capsule (20 mg total) by mouth 2 (two) times daily before a meal. 180 capsule 3   topiramate (TOPAMAX) 25 MG tablet Take 1 tablet (25 mg total) by mouth 2 (two) times daily. 180 tablet 3   SUMAtriptan (IMITREX) 100 MG tablet Take 1 tablet (100 mg total) by mouth once for 1 dose. 10 tablet 11   No current facility-administered medications on file prior to visit.    Review of Systems  Constitutional:  Negative for activity change, appetite change, fatigue, fever and unexpected weight change.  HENT:  Negative for congestion, ear pain, rhinorrhea, sinus pressure and sore throat.   Eyes:  Negative for pain, redness and visual disturbance.  Respiratory:  Negative for cough, shortness of breath and wheezing.   Cardiovascular:  Negative for chest pain and palpitations.  Gastrointestinal:  Negative for abdominal pain, blood in stool, constipation and diarrhea.  Endocrine: Negative for polydipsia and polyuria.  Genitourinary:  Negative for dysuria, frequency and urgency.  Musculoskeletal:  Negative for arthralgias, back pain and myalgias.  Skin:  Negative for pallor and rash.  Allergic/Immunologic: Negative for environmental allergies.  Neurological:  Positive for headaches. Negative for dizziness and syncope.  Hematological:  Negative for adenopathy. Does not bruise/bleed easily.  Psychiatric/Behavioral:  Negative for decreased concentration and dysphoric mood. The patient is not nervous/anxious.        Objective:   Physical Exam Constitutional:      General: She is not in acute distress.    Appearance: Normal appearance. She is well-developed. She is obese. She is not ill-appearing or diaphoretic.  HENT:     Head: Normocephalic and atraumatic.  Eyes:     Conjunctiva/sclera: Conjunctivae normal.     Pupils: Pupils are equal, round, and reactive to light.  Neck:     Thyroid: No thyromegaly.     Vascular: No  carotid bruit or JVD.  Cardiovascular:     Rate and Rhythm: Normal rate and regular rhythm.     Heart sounds: Normal heart sounds.     No gallop.  Pulmonary:     Effort: Pulmonary effort is normal. No respiratory distress.     Breath sounds: Normal breath sounds. No wheezing or rales.  Abdominal:     General: There is no distension or abdominal bruit.     Palpations: Abdomen is soft.  Musculoskeletal:     Cervical back: Normal range  of motion and neck supple.     Right lower leg: No edema.     Left lower leg: No edema.  Lymphadenopathy:     Cervical: No cervical adenopathy.  Skin:    General: Skin is warm and dry.     Coloration: Skin is not pale.     Findings: No rash.  Neurological:     Mental Status: She is alert.     Cranial Nerves: No cranial nerve deficit.     Motor: No weakness.     Coordination: Coordination normal.     Gait: Gait normal.     Deep Tendon Reflexes: Reflexes are normal and symmetric. Reflexes normal.  Psychiatric:        Mood and Affect: Mood normal.           Assessment & Plan:   Problem List Items Addressed This Visit       Cardiovascular and Mediastinum   Migraine headache without aura    More migraines lately - pt thought blood pressure related but blood pressure is ok today  Encouraged strongly to start back on topamax 25 mg bid for prophylaxis Discussed good lifestyle habits for headache prevention also        Relevant Medications   chlorthalidone (HYGROTON) 25 MG tablet   Benign essential HTN - Primary    BP: 110/71  Pt's cuff runs approx 10 points high for systolic and diastolic  Will continue to monitor  Would not increase med based on this Encouraged to continue chlorthalidone 12.5 mg daily  Also encouraged lifestyle change Last labs reviewed        Relevant Medications   chlorthalidone (HYGROTON) 25 MG tablet     Other   Perimenopause    Menses are changing  Lighter but longer May be age/perimenopausal change  (more heat intol and irritable) Could also be stress rlated Will continue to follow  Handout given

## 2023-04-15 NOTE — Assessment & Plan Note (Signed)
More migraines lately - pt thought blood pressure related but blood pressure is ok today  Encouraged strongly to start back on topamax 25 mg bid for prophylaxis Discussed good lifestyle habits for headache prevention also

## 2023-11-02 ENCOUNTER — Other Ambulatory Visit: Payer: Self-pay | Admitting: Family Medicine

## 2023-11-02 DIAGNOSIS — Z1231 Encounter for screening mammogram for malignant neoplasm of breast: Secondary | ICD-10-CM

## 2023-11-22 ENCOUNTER — Telehealth: Payer: Self-pay | Admitting: Family Medicine

## 2023-11-22 DIAGNOSIS — I10 Essential (primary) hypertension: Secondary | ICD-10-CM

## 2023-11-22 DIAGNOSIS — Z79899 Other long term (current) drug therapy: Secondary | ICD-10-CM

## 2023-11-22 DIAGNOSIS — E559 Vitamin D deficiency, unspecified: Secondary | ICD-10-CM

## 2023-11-22 DIAGNOSIS — R7303 Prediabetes: Secondary | ICD-10-CM

## 2023-11-22 NOTE — Telephone Encounter (Signed)
-----   Message from Alvina Chou sent at 11/09/2023 10:43 AM EDT ----- Regarding: Lab orders for [facility], 3.27.25 Patient is scheduled for CPX labs, please order future labs, Thanks , Camelia Eng

## 2023-11-26 ENCOUNTER — Other Ambulatory Visit (INDEPENDENT_AMBULATORY_CARE_PROVIDER_SITE_OTHER): Payer: Federal, State, Local not specified - PPO

## 2023-11-26 DIAGNOSIS — E559 Vitamin D deficiency, unspecified: Secondary | ICD-10-CM | POA: Diagnosis not present

## 2023-11-26 DIAGNOSIS — Z79899 Other long term (current) drug therapy: Secondary | ICD-10-CM

## 2023-11-26 DIAGNOSIS — R7303 Prediabetes: Secondary | ICD-10-CM | POA: Diagnosis not present

## 2023-11-26 DIAGNOSIS — I10 Essential (primary) hypertension: Secondary | ICD-10-CM

## 2023-11-26 LAB — CBC WITH DIFFERENTIAL/PLATELET
Basophils Absolute: 0 10*3/uL (ref 0.0–0.1)
Basophils Relative: 0.6 % (ref 0.0–3.0)
Eosinophils Absolute: 0 10*3/uL (ref 0.0–0.7)
Eosinophils Relative: 0.6 % (ref 0.0–5.0)
HCT: 33.9 % — ABNORMAL LOW (ref 36.0–46.0)
Hemoglobin: 10.9 g/dL — ABNORMAL LOW (ref 12.0–15.0)
Lymphocytes Relative: 30.4 % (ref 12.0–46.0)
Lymphs Abs: 2.3 10*3/uL (ref 0.7–4.0)
MCHC: 32.2 g/dL (ref 30.0–36.0)
MCV: 76 fl — ABNORMAL LOW (ref 78.0–100.0)
Monocytes Absolute: 0.5 10*3/uL (ref 0.1–1.0)
Monocytes Relative: 6.4 % (ref 3.0–12.0)
Neutro Abs: 4.7 10*3/uL (ref 1.4–7.7)
Neutrophils Relative %: 62 % (ref 43.0–77.0)
Platelets: 348 10*3/uL (ref 150.0–400.0)
RBC: 4.46 Mil/uL (ref 3.87–5.11)
RDW: 16.2 % — ABNORMAL HIGH (ref 11.5–15.5)
WBC: 7.6 10*3/uL (ref 4.0–10.5)

## 2023-11-26 LAB — COMPREHENSIVE METABOLIC PANEL WITH GFR
ALT: 11 U/L (ref 0–35)
AST: 17 U/L (ref 0–37)
Albumin: 4.4 g/dL (ref 3.5–5.2)
Alkaline Phosphatase: 62 U/L (ref 39–117)
BUN: 17 mg/dL (ref 6–23)
CO2: 30 meq/L (ref 19–32)
Calcium: 10 mg/dL (ref 8.4–10.5)
Chloride: 99 meq/L (ref 96–112)
Creatinine, Ser: 0.78 mg/dL (ref 0.40–1.20)
GFR: 95.14 mL/min (ref >60.00)
Glucose, Bld: 93 mg/dL (ref 70–99)
Potassium: 4.2 meq/L (ref 3.5–5.1)
Sodium: 137 meq/L (ref 135–145)
Total Bilirubin: 0.4 mg/dL (ref 0.2–1.2)
Total Protein: 7.4 g/dL (ref 6.0–8.3)

## 2023-11-26 LAB — LIPID PANEL
Cholesterol: 213 mg/dL — ABNORMAL HIGH (ref 0–200)
HDL: 57.4 mg/dL (ref >39.00)
LDL Cholesterol: 134 mg/dL — ABNORMAL HIGH (ref 0–99)
NonHDL: 155.36
Total CHOL/HDL Ratio: 4
Triglycerides: 106 mg/dL (ref 0.0–149.0)
VLDL: 21.2 mg/dL (ref 0.0–40.0)

## 2023-11-27 LAB — TSH: TSH: 2.07 u[IU]/mL (ref 0.35–5.50)

## 2023-11-27 LAB — VITAMIN D 25 HYDROXY (VIT D DEFICIENCY, FRACTURES): VITD: 57.58 ng/mL (ref 30.00–100.00)

## 2023-11-27 LAB — VITAMIN B12: Vitamin B-12: 943 pg/mL — ABNORMAL HIGH (ref 211–911)

## 2023-11-28 LAB — HEMOGLOBIN A1C: Hgb A1c MFr Bld: 6.4 % (ref 4.6–6.5)

## 2023-12-03 ENCOUNTER — Encounter: Payer: Self-pay | Admitting: Family Medicine

## 2023-12-03 ENCOUNTER — Ambulatory Visit (INDEPENDENT_AMBULATORY_CARE_PROVIDER_SITE_OTHER): Payer: Federal, State, Local not specified - PPO | Admitting: Family Medicine

## 2023-12-03 VITALS — BP 128/78 | HR 65 | Temp 98.4°F | Ht 64.5 in | Wt 190.2 lb

## 2023-12-03 DIAGNOSIS — N951 Menopausal and female climacteric states: Secondary | ICD-10-CM

## 2023-12-03 DIAGNOSIS — Z79899 Other long term (current) drug therapy: Secondary | ICD-10-CM | POA: Diagnosis not present

## 2023-12-03 DIAGNOSIS — I10 Essential (primary) hypertension: Secondary | ICD-10-CM

## 2023-12-03 DIAGNOSIS — Z0000 Encounter for general adult medical examination without abnormal findings: Secondary | ICD-10-CM | POA: Diagnosis not present

## 2023-12-03 DIAGNOSIS — D509 Iron deficiency anemia, unspecified: Secondary | ICD-10-CM

## 2023-12-03 DIAGNOSIS — E559 Vitamin D deficiency, unspecified: Secondary | ICD-10-CM

## 2023-12-03 DIAGNOSIS — R7303 Prediabetes: Secondary | ICD-10-CM

## 2023-12-03 DIAGNOSIS — G43009 Migraine without aura, not intractable, without status migrainosus: Secondary | ICD-10-CM

## 2023-12-03 DIAGNOSIS — K219 Gastro-esophageal reflux disease without esophagitis: Secondary | ICD-10-CM

## 2023-12-03 DIAGNOSIS — N926 Irregular menstruation, unspecified: Secondary | ICD-10-CM | POA: Insufficient documentation

## 2023-12-03 DIAGNOSIS — E669 Obesity, unspecified: Secondary | ICD-10-CM

## 2023-12-03 MED ORDER — POLYSACCHARIDE IRON COMPLEX 150 MG PO CAPS
150.0000 mg | ORAL_CAPSULE | Freq: Every day | ORAL | 3 refills | Status: DC
Start: 2023-12-03 — End: 2024-04-05

## 2023-12-03 NOTE — Progress Notes (Signed)
 Subjective:    Patient ID: Ashlee Perry, female    DOB: November 03, 1985, 38 y.o.   MRN: [id number]  HPI  Here for health maintenance exam and to review chronic medical problems   Wt Readings from Last 3 Encounters:  12/03/23 190 lb 4 oz (86.3 kg)  04/15/23 186 lb 3.2 oz (84.5 kg)  11/21/22 184 lb 2 oz (83.5 kg)   32.15 kg/m  Vitals:   12/03/23 1135  BP: 128/78  Pulse: 65  Temp: 98.4 F (36.9 C)  SpO2: 96%    Immunization History  Administered Date(s) Administered   Tdap 10/18/2014    Health Maintenance Due  Topic Date Due   Hepatitis C Screening  Never done     Mammogram-has scheduled 4/ 27/25  Self breast exam Has family history of breast cancer , M, Maunt and MGM  Had genetic test and no path variants identified   Gyn health Goes to the breast center  Perimenopause symptoms  Periods are erratic Sometimes heavy -every other one / some spotting  Mood changes/irritable   Aunt had early menopause     Last pap 2021  Good for 5 year No new partners  Not needing contraception  Declines STD    Colon cancer screening no close fam history   Bone health   Falls-none Fractures-none  Supplements  Last vitamin D Lab Results  Component Value Date   VD25OH 57.58 11/26/2023    Exercise :  Got a bike last month  Jumping jacks and stretches   Pushes /pulls -some heavy at work      Mood    12/03/2023   12:34 PM 04/15/2023   11:11 AM 11/21/2022   12:18 PM 11/18/2021   11:41 AM 10/24/2020    4:51 PM  Depression screen PHQ 2/9  Decreased Interest 0 0 0 0 0  Down, Depressed, Hopeless 0 0 0 0 0  PHQ - 2 Score 0 0 0 0 0  Altered sleeping 0 0 0  0  Tired, decreased energy 3 1 1  1   Change in appetite 1 0 0  0  Feeling bad or failure about yourself  0 0 0  0  Trouble concentrating 0 0 0  0  Moving slowly or fidgety/restless 0 0 0  0  Suicidal thoughts 0 0 0  0  PHQ-9 Score 4 1 1  1   Difficult doing work/chores  Not difficult at all Not difficult at  all  Not difficult at all   HTN bp is stable today  No cp or palpitations or headaches or edema  No side effects to medicines  BP Readings from Last 3 Encounters:  12/03/23 128/78  04/15/23 110/71  11/21/22 106/68    Chlorthalidone 12.5 mg daily  Drinking more water Less processed food    GERD Omeprazole 20 mg daily to twice daily   Lab Results  Component Value Date   VITAMINB12 943 (H) 11/26/2023   Migraines - no aura  Topamax 25 mg bid  Imitrex prn Advil   History of iron def   Lab Results  Component Value Date   WBC 7.6 11/26/2023   HGB 10.9 (L) 11/26/2023   HCT 33.9 (L) 11/26/2023   MCV 76.0 (L) 11/26/2023   PLT 348.0 11/26/2023  Is tired/no energy   Wants to eat more greens/spinach    Prediabetes  Lab Results  Component Value Date   HGBA1C 6.4 11/26/2023   HGBA1C 6.2 11/14/2022   HGBA1C 6.2 11/18/2021  Seldom alcohol/ wine   Lab Results  Component Value Date   CHOL 213 (H) 11/26/2023   CHOL 200 11/14/2022   CHOL 176 11/18/2021   Lab Results  Component Value Date   HDL 57.40 11/26/2023   HDL 58.30 11/14/2022   HDL 48.70 11/18/2021   Lab Results  Component Value Date   LDLCALC 134 (H) 11/26/2023   LDLCALC 119 (H) 11/14/2022   LDLCALC 101 (H) 11/18/2021   Lab Results  Component Value Date   TRIG 106.0 11/26/2023   TRIG 114.0 11/14/2022   TRIG 129.0 11/18/2021   Lab Results  Component Value Date   CHOLHDL 4 11/26/2023   CHOLHDL 3 11/14/2022   CHOLHDL 4 11/18/2021   No results found for: "LDLDIRECT" Eating more fish/salmon   Now some diet changes Backing off of butter and bacon     Patient Active Problem List   Diagnosis Date Noted   Irregular menses 12/03/2023   Perimenopause 04/15/2023   Genetic testing 03/26/2022   Current use of proton pump inhibitor 11/18/2021   Benign essential HTN 10/24/2020   Hair loss 10/24/2020   Iron deficiency anemia 09/20/2018   Migraine headache without aura 09/20/2018   Prediabetes  09/13/2018   Obesity (BMI 30-39.9) 12/31/2016   Encounter for routine gynecological examination 10/18/2014   Routine general medical examination at a health care facility 10/01/2014   Vitamin D deficiency 04/24/2014   GERD (gastroesophageal reflux disease) 04/24/2014   Family history of breast cancer 04/24/2014   Past Medical History:  Diagnosis Date   History of gastroesophageal reflux (GERD)    Migraine    Past Surgical History:  Procedure Laterality Date   WISDOM TOOTH EXTRACTION  2006   Social History   Tobacco Use   Smoking status: Never   Smokeless tobacco: Never  Vaping Use   Vaping status: Some Days   Substances: Nicotine, Flavoring  Substance Use Topics   Alcohol use: Yes    Comment: wine-occ   Drug use: Yes    Types: Marijuana    Comment: 1-2 times a month   Family History  Problem Relation Age of Onset   Breast cancer Mother 45   Hypertension Mother    Hypertension Father    Breast cancer Maternal Aunt    Breast cancer Maternal Aunt    Breast cancer Maternal Aunt    Prostate cancer Maternal Uncle    Prostate cancer Paternal Uncle    Diabetes Maternal Grandmother    Breast cancer Maternal Grandmother    Arthritis Maternal Grandfather    Kidney failure Maternal Grandfather    Ovarian cancer Paternal Grandmother        d. 34s   Prostate cancer Paternal Grandfather 40       metastatic   Allergies  Allergen Reactions   Amlodipine Other (See Comments)    Migraine, light/noise sensitivity, eye/facial pain   Bisoprolol     Chest pressure    Calan Sr [Verapamil]     Constipation     Lisinopril     Hair loss and cough    Current Outpatient Medications on File Prior to Visit  Medication Sig Dispense Refill   chlorthalidone (HYGROTON) 25 MG tablet Take 0.5 tablets (12.5 mg total) by mouth daily. 45 tablet 2   fluticasone (FLONASE) 50 MCG/ACT nasal spray Place 1 spray into both nostrils daily. 16 g 0   ibuprofen (ADVIL) 800 MG tablet Take 1 tablet (800  mg total) by mouth every 8 (eight) hours as needed.  21 tablet 0   Multiple Vitamins-Minerals (ALIVE WOMENS GUMMY PO) Take 1 tablet by mouth daily.     omeprazole (PRILOSEC) 20 MG capsule Take 1 capsule (20 mg total) by mouth 2 (two) times daily before a meal. 180 capsule 3   topiramate (TOPAMAX) 25 MG tablet Take 1 tablet (25 mg total) by mouth 2 (two) times daily. 180 tablet 3   SUMAtriptan (IMITREX) 100 MG tablet Take 1 tablet (100 mg total) by mouth once for 1 dose. 10 tablet 11   No current facility-administered medications on file prior to visit.    Review of Systems  Constitutional:  Positive for fatigue. Negative for activity change, appetite change, fever and unexpected weight change.  HENT:  Negative for congestion, ear pain, rhinorrhea, sinus pressure and sore throat.   Eyes:  Negative for pain, redness and visual disturbance.  Respiratory:  Negative for cough, shortness of breath and wheezing.   Cardiovascular:  Negative for chest pain and palpitations.  Gastrointestinal:  Negative for abdominal pain, blood in stool, constipation and diarrhea.  Endocrine: Negative for polydipsia and polyuria.  Genitourinary:  Positive for menstrual problem. Negative for dysuria, frequency and urgency.  Musculoskeletal:  Positive for arthralgias. Negative for back pain and myalgias.  Skin:  Negative for pallor and rash.  Allergic/Immunologic: Negative for environmental allergies.  Neurological:  Positive for headaches. Negative for dizziness and syncope.  Hematological:  Negative for adenopathy. Does not bruise/bleed easily.  Psychiatric/Behavioral:  Negative for decreased concentration and dysphoric mood. The patient is not nervous/anxious.        Mood ok  Fatigue affects it at times        Objective:   Physical Exam Constitutional:      General: She is not in acute distress.    Appearance: Normal appearance. She is well-developed. She is obese. She is not ill-appearing or diaphoretic.      Comments: Fatigued   HENT:     Head: Normocephalic and atraumatic.     Right Ear: Tympanic membrane, ear canal and external ear normal.     Left Ear: Tympanic membrane, ear canal and external ear normal.     Nose: Nose normal. No congestion.     Mouth/Throat:     Mouth: Mucous membranes are moist.     Pharynx: Oropharynx is clear. No posterior oropharyngeal erythema.  Eyes:     General: No scleral icterus.    Extraocular Movements: Extraocular movements intact.     Conjunctiva/sclera: Conjunctivae normal.     Pupils: Pupils are equal, round, and reactive to light.  Neck:     Thyroid: No thyromegaly.     Vascular: No carotid bruit or JVD.  Cardiovascular:     Rate and Rhythm: Normal rate and regular rhythm.     Pulses: Normal pulses.     Heart sounds: Normal heart sounds.     No gallop.  Pulmonary:     Effort: Pulmonary effort is normal. No respiratory distress.     Breath sounds: Normal breath sounds. No wheezing.     Comments: Good air exch Chest:     Chest wall: No tenderness.  Abdominal:     General: Bowel sounds are normal. There is no distension or abdominal bruit.     Palpations: Abdomen is soft. There is no mass.     Tenderness: There is no abdominal tenderness.     Hernia: No hernia is present.  Musculoskeletal:        General: No tenderness. Normal range  of motion.     Cervical back: Normal range of motion and neck supple. No rigidity. No muscular tenderness.     Right lower leg: No edema.     Left lower leg: No edema.     Comments: No kyphosis   Lymphadenopathy:     Cervical: No cervical adenopathy.  Skin:    General: Skin is warm and dry.     Coloration: Skin is not pale.     Findings: No erythema or rash.     Comments: Solar lentigines diffusely Few raised 1-2 mm brown moles on chest   Neurological:     Mental Status: She is alert. Mental status is at baseline.     Cranial Nerves: No cranial nerve deficit.     Motor: No abnormal muscle tone.      Coordination: Coordination normal.     Gait: Gait normal.     Deep Tendon Reflexes: Reflexes are normal and symmetric. Reflexes normal.  Psychiatric:        Mood and Affect: Mood normal.        Cognition and Memory: Cognition and memory normal.           Assessment & Plan:   Problem List Items Addressed This Visit       Cardiovascular and Mediastinum   Migraine headache without aura   Stable  Continues topamax for proph  Also imitrex for rescue       Benign essential HTN   bp in fair control at this time  BP Readings from Last 1 Encounters:  12/03/23 128/78   No changes needed Most recent labs reviewed  Disc lifstyle change with low sodium diet and exercise  Continues chlorthalidone 12.5 mg daily          Digestive   GERD (gastroesophageal reflux disease)   Takes omeprazole 20 mg once to twide daily  Watching vitamin levels   Encouraged to avoid triggers  Weight loss may help        Other   Vitamin D deficiency   Last vitamin D Lab Results  Component Value Date   VD25OH 57.58 11/26/2023   Vitamin D level is therapeutic with current supplementation Disc importance of this to bone and overall health       Routine general medical examination at a health care facility - Primary   Reviewed health habits including diet and exercise and skin cancer prevention Reviewed appropriate screening tests for age  Also reviewed health mt list, fam hx and immunization status , as well as social and family history   See HPI Labs reviewed and ordered Health Maintenance  Topic Date Due   Hepatitis C Screening  Never done   COVID-19 Vaccine (1) 12/18/2025*   Mammogram  12/18/2023   Flu Shot  04/01/2024   Pap with HPV screening  09/27/2024   DTaP/Tdap/Td vaccine (2 - Td or Tdap) 10/18/2024   HIV Screening  Completed   HPV Vaccine  Aged Out  *Topic was postponed. The date shown is not the original due date.   Has mammogram planned this month  Pap 2021 , ref to  gyn for routine care and perimenopause symptoms Discussed fall prevention, supplements and exercise for bone density  PHQ 4 in setting of fatigue and peri menopause  Declines std screening       Prediabetes   Lab Results  Component Value Date   HGBA1C 6.4 11/26/2023   HGBA1C 6.2 11/14/2022   HGBA1C 6.2 11/18/2021   disc  imp of low glycemic diet and wt loss to prevent DM2  Follow up 3 mo      Perimenopause   Ref to gyn Vasomotor symptoms  Erratic menses      Relevant Orders   Ambulatory referral to Gynecology   Obesity (BMI 30-39.9)   Discussed how this problem influences overall health and the risks it imposes  Reviewed plan for weight loss with lower calorie diet (via better food choices (lower glycemic and portion control) along with exercise building up to or more than 30 minutes 5 days per week including some aerobic activity and strength training         Irregular menses   With fluctuating heaviness Suspect perimenopause  Fatigue /weight gain and vasomotor symptoms Will refer to gyn ? If oc would be helpful      Relevant Orders   Ambulatory referral to Gynecology   Iron deficiency anemia   Hb down to 10.9 More tired Menses can be heavy  Prescription niferex 150 mg daily  Reviewed possible side effects  Re check 1 month wit hiron level  Follow up 3 mo      Relevant Medications   iron polysaccharides (NIFEREX) 150 MG capsule   Current use of proton pump inhibitor   Lab Results  Component Value Date   VITAMINB12 943 (H) 11/26/2023   Last vitamin D Lab Results  Component Value Date   VD25OH 57.58 11/26/2023

## 2023-12-03 NOTE — Assessment & Plan Note (Signed)
 Ref to gyn Vasomotor symptoms  Erratic menses

## 2023-12-03 NOTE — Patient Instructions (Addendum)
 To prevent diabetes  Try to get most of your carbohydrates from produce (with the exception of white potatoes) and whole grains Eat less bread/pasta/rice/snack foods/cereals/sweets and other items from the middle of the grocery store (processed carbs)  For cholesterol  Avoid red meat/ fried foods/ egg yolks/ fatty breakfast meats/ butter, cheese and high fat dairy/ and shellfish     I put the referral in for gynecology  Please let us know if you don't hear in 1-2 weeks    Keep biking Add some strength training to your routine, this is important for bone and brain health and can reduce your risk of falls and help your body use insulin properly and regulate weight  Light weights, exercise bands , and internet videos are a good way to start  Yoga (chair or regular), machines , floor exercises or a gym with machines are also good options   Get protein with every meal  The following are examples of protein in diet  Meat  Fish  Eggs  Dairy products  Soy products  Oat milk  Almond milk Nuts and nut butters  Dried beans     Start iron  Niferex 150 mg  One daily with a meal  If you get constipated - use mirlax or stool softener like colace   Re check cbc in about a month  Follow up with me in 3 months

## 2023-12-03 NOTE — Assessment & Plan Note (Signed)
 Hb down to 10.9 More tired Menses can be heavy  Prescription niferex 150 mg daily  Reviewed possible side effects  Re check 1 month wit hiron level  Follow up 3 mo

## 2023-12-03 NOTE — Assessment & Plan Note (Signed)
 With fluctuating heaviness Suspect perimenopause  Fatigue /weight gain and vasomotor symptoms Will refer to gyn ? If oc would be helpful

## 2023-12-03 NOTE — Assessment & Plan Note (Signed)
 bp in fair control at this time  BP Readings from Last 1 Encounters:  12/03/23 128/78   No changes needed Most recent labs reviewed  Disc lifstyle change with low sodium diet and exercise  Continues chlorthalidone 12.5 mg daily

## 2023-12-03 NOTE — Assessment & Plan Note (Signed)
 Lab Results  Component Value Date   HGBA1C 6.4 11/26/2023   HGBA1C 6.2 11/14/2022   HGBA1C 6.2 11/18/2021   disc imp of low glycemic diet and wt loss to prevent DM2  Follow up 3 mo

## 2023-12-03 NOTE — Assessment & Plan Note (Signed)
 Stable  Continues topamax for proph  Also imitrex for rescue

## 2023-12-03 NOTE — Assessment & Plan Note (Addendum)
 Reviewed health habits including diet and exercise and skin cancer prevention Reviewed appropriate screening tests for age  Also reviewed health mt list, fam hx and immunization status , as well as social and family history   See HPI Labs reviewed and ordered Health Maintenance  Topic Date Due   Hepatitis C Screening  Never done   COVID-19 Vaccine (1) 12/18/2025*   Mammogram  12/18/2023   Flu Shot  04/01/2024   Pap with HPV screening  09/27/2024   DTaP/Tdap/Td vaccine (2 - Td or Tdap) 10/18/2024   HIV Screening  Completed   HPV Vaccine  Aged Out  *Topic was postponed. The date shown is not the original due date.   Has mammogram planned this month  Pap 2021 , ref to gyn for routine care and perimenopause symptoms Discussed fall prevention, supplements and exercise for bone density  PHQ 4 in setting of fatigue and peri menopause  Declines std screening

## 2023-12-03 NOTE — Assessment & Plan Note (Signed)
 Discussed how this problem influences overall health and the risks it imposes  Reviewed plan for weight loss with lower calorie diet (via better food choices (lower glycemic and portion control) along with exercise building up to or more than 30 minutes 5 days per week including some aerobic activity and strength training

## 2023-12-03 NOTE — Assessment & Plan Note (Signed)
 Lab Results  Component Value Date   VITAMINB12 943 (H) 11/26/2023   Last vitamin D Lab Results  Component Value Date   VD25OH 57.58 11/26/2023

## 2023-12-03 NOTE — Assessment & Plan Note (Signed)
 Last vitamin D Lab Results  Component Value Date   VD25OH 57.58 11/26/2023   Vitamin D level is therapeutic with current supplementation Disc importance of this to bone and overall health

## 2023-12-03 NOTE — Assessment & Plan Note (Signed)
 Takes omeprazole 20 mg once to twide daily  Watching vitamin levels   Encouraged to avoid triggers  Weight loss may help

## 2023-12-04 ENCOUNTER — Other Ambulatory Visit: Payer: Self-pay | Admitting: Family Medicine

## 2023-12-21 ENCOUNTER — Ambulatory Visit
Admission: RE | Admit: 2023-12-21 | Discharge: 2023-12-21 | Disposition: A | Discharge status: Home / Self Care | Source: Ambulatory Visit | Attending: Family Medicine | Admitting: Family Medicine

## 2023-12-21 DIAGNOSIS — Z1231 Encounter for screening mammogram for malignant neoplasm of breast: Secondary | ICD-10-CM | POA: Diagnosis not present

## 2023-12-24 ENCOUNTER — Encounter: Payer: Self-pay | Admitting: Family Medicine

## 2024-01-04 ENCOUNTER — Other Ambulatory Visit

## 2024-01-04 ENCOUNTER — Encounter: Payer: Self-pay | Admitting: Family Medicine

## 2024-01-04 ENCOUNTER — Other Ambulatory Visit (INDEPENDENT_AMBULATORY_CARE_PROVIDER_SITE_OTHER)

## 2024-01-04 DIAGNOSIS — D509 Iron deficiency anemia, unspecified: Secondary | ICD-10-CM | POA: Diagnosis not present

## 2024-01-04 LAB — CBC WITH DIFFERENTIAL/PLATELET
Basophils Absolute: 0 10*3/uL (ref 0.0–0.1)
Basophils Relative: 0.7 % (ref 0.0–3.0)
Eosinophils Absolute: 0.1 10*3/uL (ref 0.0–0.7)
Eosinophils Relative: 0.8 % (ref 0.0–5.0)
HCT: 37.3 % (ref 36.0–46.0)
Hemoglobin: 12 g/dL (ref 12.0–15.0)
Lymphocytes Relative: 38.4 % (ref 12.0–46.0)
Lymphs Abs: 2.6 10*3/uL (ref 0.7–4.0)
MCHC: 32.2 g/dL (ref 30.0–36.0)
MCV: 77.1 fl — ABNORMAL LOW (ref 78.0–100.0)
Monocytes Absolute: 0.5 10*3/uL (ref 0.1–1.0)
Monocytes Relative: 7.3 % (ref 3.0–12.0)
Neutro Abs: 3.5 10*3/uL (ref 1.4–7.7)
Neutrophils Relative %: 52.8 % (ref 43.0–77.0)
Platelets: 340 10*3/uL (ref 150.0–400.0)
RBC: 4.83 Mil/uL (ref 3.87–5.11)
RDW: 16.8 % — ABNORMAL HIGH (ref 11.5–15.5)
WBC: 6.7 10*3/uL (ref 4.0–10.5)

## 2024-01-04 LAB — FERRITIN: Ferritin: 4.8 ng/mL — ABNORMAL LOW (ref 10.0–291.0)

## 2024-01-04 LAB — IRON: Iron: 41 ug/dL — ABNORMAL LOW (ref 42–145)

## 2024-01-21 ENCOUNTER — Other Ambulatory Visit: Payer: Self-pay | Admitting: Family Medicine

## 2024-02-10 ENCOUNTER — Ambulatory Visit: Payer: Self-pay | Admitting: Obstetrics and Gynecology

## 2024-02-17 ENCOUNTER — Encounter: Payer: Self-pay | Admitting: Obstetrics and Gynecology

## 2024-02-17 ENCOUNTER — Ambulatory Visit (INDEPENDENT_AMBULATORY_CARE_PROVIDER_SITE_OTHER): Admitting: Obstetrics and Gynecology

## 2024-02-17 VITALS — BP 136/88 | HR 78 | Ht 64.0 in | Wt 184.2 lb

## 2024-02-17 DIAGNOSIS — N926 Irregular menstruation, unspecified: Secondary | ICD-10-CM | POA: Diagnosis not present

## 2024-02-17 DIAGNOSIS — N898 Other specified noninflammatory disorders of vagina: Secondary | ICD-10-CM

## 2024-02-17 DIAGNOSIS — Z1331 Encounter for screening for depression: Secondary | ICD-10-CM

## 2024-02-17 DIAGNOSIS — N951 Menopausal and female climacteric states: Secondary | ICD-10-CM

## 2024-02-17 DIAGNOSIS — D508 Other iron deficiency anemias: Secondary | ICD-10-CM | POA: Diagnosis not present

## 2024-02-17 DIAGNOSIS — E28319 Asymptomatic premature menopause: Secondary | ICD-10-CM

## 2024-02-17 LAB — PREGNANCY, URINE: Preg Test, Ur: NEGATIVE

## 2024-02-17 MED ORDER — LIDOCAINE HCL 2 % EX GEL: 1.0000 | CUTANEOUS | 0 refills | Status: AC | PRN

## 2024-02-17 NOTE — Progress Notes (Signed)
 38 y.o. y.o. female here for annual exam. Patient's last menstrual period was 10/05/2023 (approximate). Period Pattern: (!) Irregular Menstrual Flow: Light (irregular spotting) Menstrual Control: Panty liner, Thin pad Dysmenorrhea: None (headaches, fatigue, hot flashes) Used to have heavy periods off and on months for 7 days with cramps couple days before. Since Feb/March only has a random day of spotting Low iron  level seen in May    CHTN. No HRT, no ocp's Hot flashes since early to mid 30's. Does not feel like herself. Some irritability, hair loss. No insomnia or joint pain. Reports fatigue but feels better on iron  every other day. Has constipation on it.  Hormone panel today Dxa: likely early menopause to get baseline dxa Last mammogram: 12/21/23 Last colonoscopy: sent for early dx colonoscopy for only spotting and having anemia Body mass index is 31.62 kg/m.     12/03/2023   12:34 PM 04/15/2023   11:11 AM 11/21/2022   12:18 PM  Depression screen PHQ 2/9  Decreased Interest 0 0 0  Down, Depressed, Hopeless 0 0 0  PHQ - 2 Score 0 0 0  Altered sleeping 0 0 0  Tired, decreased energy 3 1 1   Change in appetite 1 0 0  Feeling bad or failure about yourself  0 0 0  Trouble concentrating 0 0 0  Moving slowly or fidgety/restless 0 0 0  Suicidal thoughts 0 0 0  PHQ-9 Score 4 1 1   Difficult doing work/chores  Not difficult at all Not difficult at all    Blood pressure 136/88, pulse 78, height 5' 4 (1.626 m), weight 184 lb 3.2 oz (83.6 kg), last menstrual period 10/05/2023, SpO2 99%.     Component Value Date/Time   DIAGPAP  09/28/2019 1213    - Negative for intraepithelial lesion or malignancy (NILM)   DIAGPAP  12/31/2016 0000    NEGATIVE FOR INTRAEPITHELIAL LESIONS OR MALIGNANCY.   HPVHIGH Negative 09/28/2019 1213   ADEQPAP  09/28/2019 1213    Satisfactory for evaluation; transformation zone component PRESENT.   ADEQPAP  12/31/2016 0000    Satisfactory for evaluation   endocervical/transformation zone component PRESENT.    GYN HISTORY:    Component Value Date/Time   DIAGPAP  09/28/2019 1213    - Negative for intraepithelial lesion or malignancy (NILM)   DIAGPAP  12/31/2016 0000    NEGATIVE FOR INTRAEPITHELIAL LESIONS OR MALIGNANCY.   HPVHIGH Negative 09/28/2019 1213   ADEQPAP  09/28/2019 1213    Satisfactory for evaluation; transformation zone component PRESENT.   ADEQPAP  12/31/2016 0000    Satisfactory for evaluation  endocervical/transformation zone component PRESENT.    OB History  Gravida Para Term Preterm AB Living  0 0 0 0 0 0  SAB IAB Ectopic Multiple Live Births  0 0 0 0 0    Past Medical History:  Diagnosis Date   History of gastroesophageal reflux (GERD)    Hypertension    Migraine     Past Surgical History:  Procedure Laterality Date   WISDOM TOOTH EXTRACTION  2006    Current Outpatient Medications on File Prior to Visit  Medication Sig Dispense Refill   chlorthalidone  (HYGROTON ) 25 MG tablet TAKE 1/2 TABLET BY MOUTH EVERY DAY 45 tablet 0   fluticasone  (FLONASE ) 50 MCG/ACT nasal spray Place 1 spray into both nostrils daily. 16 g 0   ibuprofen  (ADVIL ) 800 MG tablet Take 1 tablet (800 mg total) by mouth every 8 (eight) hours as needed. 21 tablet 0  iron  polysaccharides (NIFEREX) 150 MG capsule Take 1 capsule (150 mg total) by mouth daily. 30 capsule 3   Multiple Vitamins-Minerals (ALIVE WOMENS GUMMY PO) Take 1 tablet by mouth daily.     omeprazole  (PRILOSEC) 20 MG capsule TAKE 1 CAPSULE (20 MG TOTAL) BY MOUTH 2 (TWO) TIMES DAILY BEFORE A MEAL. 180 capsule 3   VITAMIN D  PO Take by mouth.     SUMAtriptan  (IMITREX ) 100 MG tablet Take 1 tablet (100 mg total) by mouth once for 1 dose. (Patient not taking: Reported on 02/17/2024) 10 tablet 11   topiramate  (TOPAMAX ) 25 MG tablet Take 1 tablet (25 mg total) by mouth 2 (two) times daily. (Patient not taking: Reported on 02/17/2024) 180 tablet 3   No current facility-administered  medications on file prior to visit.    Social History   Socioeconomic History   Marital status: Single    Spouse name: Not on file   Number of children: Not on file   Years of education: Not on file   Highest education level: Not on file  Occupational History   Not on file  Tobacco Use   Smoking status: Former    Types: E-cigarettes   Smokeless tobacco: Never   Tobacco comments:    Former Press photographer status: Some Days   Substances: Nicotine, Flavoring  Substance and Sexual Activity   Alcohol use: Not Currently    Comment: wine-occ   Drug use: Not Currently    Types: Marijuana    Comment: 1-2 times a month   Sexual activity: Not Currently    Partners: Male    Comment: menarche  11yo  Other Topics Concern   Not on file  Social History Narrative   Not on file   Social Drivers of Health   Financial Resource Strain: Not on file  Food Insecurity: Not on file  Transportation Needs: Not on file  Physical Activity: Not on file  Stress: Not on file  Social Connections: Not on file  Intimate Partner Violence: Not on file    Family History  Problem Relation Age of Onset   Breast cancer Mother 60   Hypertension Mother    Hypertension Father    Breast cancer Maternal Aunt    Breast cancer Maternal Aunt    Breast cancer Maternal Aunt    Prostate cancer Maternal Uncle    Lung cancer Maternal Uncle    Prostate cancer Paternal Uncle    Diabetes Maternal Grandmother    Breast cancer Maternal Grandmother    Arthritis Maternal Grandfather    Kidney failure Maternal Grandfather    Ovarian cancer Paternal Grandmother        d. 41s   Prostate cancer Paternal Grandfather 22       metastatic     Allergies  Allergen Reactions   Amlodipine  Other (See Comments)    Migraine, light/noise sensitivity, eye/facial pain   Bisoprolol      Chest pressure    Calan  Sr [Verapamil ]     Constipation     Lisinopril     Hair loss and cough       Patient's last  menstrual period was Patient's last menstrual period was 10/05/2023 (approximate)..            Review of Systems Alls systems reviewed and are negative.     OBGyn Exam    A:         Presents for irregular periods, anemia early menopause with perimenopause symptoms starting  in her 44's                             P:        labs: see orders Referral to hematology and GI to evaluate source of anemia with only light spotting  To get HRT panel and PUS RTC for annual exam Patient would like lidocaine placed for PUS. Prescription sent to bring with her to her PUS To get dxa with signs of loss of estrogen beginning in her 30's 30 minutes spent on reviewing records, imaging,  and one on one patient time and counseling patient and documentation Dr. Tia Flowers  No follow-ups on file.  Reinaldo Caras

## 2024-02-18 ENCOUNTER — Ambulatory Visit: Payer: Self-pay | Admitting: Obstetrics and Gynecology

## 2024-02-18 ENCOUNTER — Encounter: Payer: Self-pay | Admitting: Gastroenterology

## 2024-02-18 LAB — ESTRADIOL: Estradiol: 27 pg/mL

## 2024-02-18 LAB — FOLLICLE STIMULATING HORMONE: FSH: 8.5 m[IU]/mL

## 2024-02-18 NOTE — Addendum Note (Signed)
 Addended by: Reinaldo Caras on: 02/18/2024 09:14 AM   Modules accepted: Orders

## 2024-02-18 NOTE — Addendum Note (Signed)
 Addended by: Letonia Stead on: 02/18/2024 09:24 AM   Modules accepted: Orders

## 2024-02-19 LAB — HEMOGLOBINOPATHY EVALUATION
Fetal Hemoglobin Testing: <1 % (ref 0.0–1.9)
HCT: 37.2 % (ref 35.0–45.0)
Hemoglobin A2 - HGBRFX: 2.5 % (ref 2.0–3.2)
Hemoglobin: 11.2 g/dL — ABNORMAL LOW (ref 11.7–15.5)
Hgb A: 97.5 % (ref >96.0)
MCH: 24.7 pg — ABNORMAL LOW (ref 27.0–33.0)
MCV: 81.9 fL (ref 80.0–100.0)
RBC: 4.54 10*6/uL (ref 3.80–5.10)
RDW: 15.7 % — ABNORMAL HIGH (ref 11.0–15.0)

## 2024-02-19 LAB — IRON,TIBC AND FERRITIN PANEL
%SAT: 12 % — ABNORMAL LOW (ref 16–45)
Ferritin: 7 ng/mL — ABNORMAL LOW (ref 16–154)
Iron: 45 ug/dL (ref 40–190)
TIBC: 384 ug/dL (ref 250–450)

## 2024-02-22 ENCOUNTER — Telehealth (HOSPITAL_BASED_OUTPATIENT_CLINIC_OR_DEPARTMENT_OTHER): Payer: Self-pay

## 2024-02-22 LAB — TESTOS,TOTAL,FREE AND SHBG (FEMALE)
Free Testosterone: 2.8 pg/mL (ref 0.1–6.4)
Sex Hormone Binding: 22 nmol/L (ref 17–124)
Testosterone, Total, LC-MS-MS: 15 ng/dL (ref 2–45)

## 2024-03-15 ENCOUNTER — Ambulatory Visit (HOSPITAL_BASED_OUTPATIENT_CLINIC_OR_DEPARTMENT_OTHER)
Admission: RE | Admit: 2024-03-15 | Discharge: 2024-03-15 | Disposition: A | Discharge status: Home / Self Care | Source: Ambulatory Visit | Attending: Obstetrics and Gynecology | Admitting: Obstetrics and Gynecology

## 2024-03-15 DIAGNOSIS — Z0389 Encounter for observation for other suspected diseases and conditions ruled out: Secondary | ICD-10-CM | POA: Diagnosis not present

## 2024-03-15 DIAGNOSIS — E28319 Asymptomatic premature menopause: Secondary | ICD-10-CM | POA: Diagnosis not present

## 2024-03-17 ENCOUNTER — Ambulatory Visit (INDEPENDENT_AMBULATORY_CARE_PROVIDER_SITE_OTHER): Admitting: Obstetrics and Gynecology

## 2024-03-17 ENCOUNTER — Ambulatory Visit

## 2024-03-17 DIAGNOSIS — N926 Irregular menstruation, unspecified: Secondary | ICD-10-CM

## 2024-03-17 DIAGNOSIS — Z712 Person consulting for explanation of examination or test findings: Secondary | ICD-10-CM

## 2024-03-17 MED ORDER — NORETHINDRONE 0.35 MG PO TABS: 1.0000 | ORAL_TABLET | Freq: Every day | ORAL | 6 refills | Status: AC

## 2024-03-17 NOTE — Progress Notes (Signed)
 Spoke with patient briefly. Could not tolerate the PUS and abdominal US  could not be completed with empty bladder. Patient reports daily bleeding but not heavy. Offered to try micronor  and counseled on the birth control. She is not ready for any surgical options. RTC in 2-3 months from starting for follow-up Dr. Glennon

## 2024-03-21 ENCOUNTER — Ambulatory Visit: Admitting: Family Medicine

## 2024-03-22 ENCOUNTER — Ambulatory Visit: Admitting: Family Medicine

## 2024-03-28 ENCOUNTER — Ambulatory Visit: Payer: Self-pay | Admitting: Family Medicine

## 2024-03-28 ENCOUNTER — Encounter: Payer: Self-pay | Admitting: Family Medicine

## 2024-03-28 ENCOUNTER — Ambulatory Visit (INDEPENDENT_AMBULATORY_CARE_PROVIDER_SITE_OTHER): Admitting: Family Medicine

## 2024-03-28 VITALS — BP 112/76 | HR 63 | Temp 97.8°F | Ht 64.0 in | Wt 179.0 lb

## 2024-03-28 DIAGNOSIS — E669 Obesity, unspecified: Secondary | ICD-10-CM | POA: Diagnosis not present

## 2024-03-28 DIAGNOSIS — N951 Menopausal and female climacteric states: Secondary | ICD-10-CM

## 2024-03-28 DIAGNOSIS — E785 Hyperlipidemia, unspecified: Secondary | ICD-10-CM | POA: Insufficient documentation

## 2024-03-28 DIAGNOSIS — I10 Essential (primary) hypertension: Secondary | ICD-10-CM | POA: Diagnosis not present

## 2024-03-28 DIAGNOSIS — E7800 Pure hypercholesterolemia, unspecified: Secondary | ICD-10-CM | POA: Diagnosis not present

## 2024-03-28 DIAGNOSIS — R7303 Prediabetes: Secondary | ICD-10-CM | POA: Diagnosis not present

## 2024-03-28 DIAGNOSIS — N926 Irregular menstruation, unspecified: Secondary | ICD-10-CM

## 2024-03-28 LAB — LIPID PANEL
Cholesterol: 197 mg/dL (ref 0–200)
HDL: 55.8 mg/dL (ref >39.00)
LDL Cholesterol: 119 mg/dL — ABNORMAL HIGH (ref 0–99)
NonHDL: 141.25
Total CHOL/HDL Ratio: 4
Triglycerides: 113 mg/dL (ref 0.0–149.0)
VLDL: 22.6 mg/dL (ref 0.0–40.0)

## 2024-03-28 LAB — BASIC METABOLIC PANEL WITH GFR
BUN: 15 mg/dL (ref 6–23)
CO2: 29 meq/L (ref 19–32)
Calcium: 9.9 mg/dL (ref 8.4–10.5)
Chloride: 101 meq/L (ref 96–112)
Creatinine, Ser: 0.8 mg/dL (ref 0.40–1.20)
GFR: 92.08 mL/min (ref >60.00)
Glucose, Bld: 97 mg/dL (ref 70–99)
Potassium: 4.3 meq/L (ref 3.5–5.1)
Sodium: 138 meq/L (ref 135–145)

## 2024-03-28 LAB — HEMOGLOBIN A1C: Hgb A1c MFr Bld: 6.3 % (ref 4.6–6.5)

## 2024-03-28 NOTE — Assessment & Plan Note (Signed)
 Did not tolerate vaginal us  , seeing gyn  On micronor - having improvement   Also bmd in normal range  Reassuring

## 2024-03-28 NOTE — Assessment & Plan Note (Signed)
 Saw gyn Dr Glennon  Did not tolerate US    Taking mironor and tolerating that Some improvement in bleeding

## 2024-03-28 NOTE — Patient Instructions (Addendum)
 Keep up the great work with diet and exercise   Try to get most of your carbohydrates from produce (with the exception of white potatoes) and whole grains Eat less bread/pasta/rice/snack foods/cereals/sweets and other items from the middle of the grocery store (processed carbs)  Make sure to get lots of lean protein so you can build muscle

## 2024-03-28 NOTE — Assessment & Plan Note (Signed)
 Discussed how this problem influences overall health and the risks it imposes  Reviewed plan for weight loss with lower calorie diet (via better food choices (lower glycemic and portion control) along with exercise building up to or more than 30 minutes 5 days per week including some aerobic activity and strength training    Doing great so far with weight loss Commended

## 2024-03-28 NOTE — Progress Notes (Signed)
 Subjective:    Patient ID: Ashlee Perry, female    DOB: Oct 08, 1985, 38 y.o.   MRN: [id number]  HPI  Wt Readings from Last 3 Encounters:  03/28/24 179 lb (81.2 kg)  02/17/24 184 lb 3.2 oz (83.6 kg)  12/03/23 190 lb 4 oz (86.3 kg)   30.73 kg/m  Vitals:   03/28/24 1013  BP: 112/76  Pulse: 63  Temp: 97.8 F (36.6 C)  SpO2: 96%    Pt presents for follow up of  Hyperlipidemia Prediabetes HTN  Doing well  Eating right  Losing weight   Eating more salads  Salmon  Berries   If sweets- just has a few bites   Exercise  Bike Walks a mile after work every day   Jumping jacks  Has fitness watch Using 10 lb weights also    Drinks only water   Feels good   bp is stable today  No cp or palpitations or headaches or edema  No side effects to medicines  BP Readings from Last 3 Encounters:  03/28/24 112/76  02/17/24 136/88  12/03/23 128/78    Chlorthalidone  12.5 mg daily  Lab Results  Component Value Date   NA 137 11/26/2023   K 4.2 11/26/2023   CO2 30 11/26/2023   GLUCOSE 93 11/26/2023   BUN 17 11/26/2023   CREATININE 0.78 11/26/2023   CALCIUM 10.0 11/26/2023   GFR 95.14 11/26/2023   GFRNONAA >60 10/31/2021    Lipids Lab Results  Component Value Date   CHOL 213 (H) 11/26/2023   HDL 57.40 11/26/2023   LDLCALC 134 (H) 11/26/2023   TRIG 106.0 11/26/2023   CHOLHDL 4 11/26/2023   The 10-year ASCVD risk score (Arnett DK, et al., 2019) is: 0.7%   Values used to calculate the score:     Age: 52 years     Clincally relevant sex: Female     Is Non-Hispanic African American: Yes     Diabetic: No     Tobacco smoker: No     Systolic Blood Pressure: 112 mmHg     Is BP treated: Yes     HDL Cholesterol: 57.4 mg/dL     Total Cholesterol: 213 mg/dL  Had a good bone density test   Did not tolerate US  probe On micronor    Patient Active Problem List   Diagnosis Date Noted   Hyperlipidemia 03/28/2024   Irregular menses 12/03/2023   Perimenopause  04/15/2023   Genetic testing 03/26/2022   Current use of proton pump inhibitor 11/18/2021   Benign essential HTN 10/24/2020   Hair loss 10/24/2020   Iron  deficiency anemia 09/20/2018   Migraine headache without aura 09/20/2018   Prediabetes 09/13/2018   Obesity (BMI 30-39.9) 12/31/2016   Encounter for routine gynecological examination 10/18/2014   Routine general medical examination at a health care facility 10/01/2014   Vitamin D  deficiency 04/24/2014   GERD (gastroesophageal reflux disease) 04/24/2014   Family history of breast cancer 04/24/2014   Past Medical History:  Diagnosis Date   History of gastroesophageal reflux (GERD)    Hypertension    Migraine    Past Surgical History:  Procedure Laterality Date   WISDOM TOOTH EXTRACTION  2006   Social History   Tobacco Use   Smoking status: Former    Types: E-cigarettes   Smokeless tobacco: Never   Tobacco comments:    Former Press photographer status: Some Days   Substances: Nicotine, Flavoring  Substance Use Topics  Alcohol use: Not Currently    Comment: wine-occ   Drug use: Not Currently    Types: Marijuana    Comment: 1-2 times a month   Family History  Problem Relation Age of Onset   Breast cancer Mother 32   Hypertension Mother    Hypertension Father    Breast cancer Maternal Aunt    Breast cancer Maternal Aunt    Breast cancer Maternal Aunt    Prostate cancer Maternal Uncle    Lung cancer Maternal Uncle    Prostate cancer Paternal Uncle    Diabetes Maternal Grandmother    Breast cancer Maternal Grandmother    Arthritis Maternal Grandfather    Kidney failure Maternal Grandfather    Ovarian cancer Paternal Grandmother        d. 61s   Prostate cancer Paternal Grandfather 58       metastatic   Allergies  Allergen Reactions   Amlodipine  Other (See Comments)    Migraine, light/noise sensitivity, eye/facial pain   Bisoprolol      Chest pressure    Calan  Sr [Verapamil ]     Constipation      Lisinopril     Hair loss and cough    Current Outpatient Medications on File Prior to Visit  Medication Sig Dispense Refill   chlorthalidone  (HYGROTON ) 25 MG tablet TAKE 1/2 TABLET BY MOUTH EVERY DAY 45 tablet 0   fluticasone  (FLONASE ) 50 MCG/ACT nasal spray Place 1 spray into both nostrils daily. 16 g 0   ibuprofen  (ADVIL ) 800 MG tablet Take 1 tablet (800 mg total) by mouth every 8 (eight) hours as needed. 21 tablet 0   iron  polysaccharides (NIFEREX) 150 MG capsule Take 1 capsule (150 mg total) by mouth daily. 30 capsule 3   lidocaine  (XYLOCAINE ) 2 % jelly Apply 1 Application topically as needed (once with procedure). 30 mL 0   Multiple Vitamins-Minerals (ALIVE WOMENS GUMMY PO) Take 1 tablet by mouth daily.     norethindrone  (MICRONOR ) 0.35 MG tablet Take 1 tablet (0.35 mg total) by mouth daily. 84 tablet 6   omeprazole  (PRILOSEC) 20 MG capsule TAKE 1 CAPSULE (20 MG TOTAL) BY MOUTH 2 (TWO) TIMES DAILY BEFORE A MEAL. 180 capsule 3   SUMAtriptan  (IMITREX ) 100 MG tablet Take 1 tablet (100 mg total) by mouth once for 1 dose. 10 tablet 11   topiramate  (TOPAMAX ) 25 MG tablet Take 1 tablet (25 mg total) by mouth 2 (two) times daily. 180 tablet 3   VITAMIN D  PO Take by mouth.     No current facility-administered medications on file prior to visit.    Review of Systems  Constitutional:  Negative for activity change, appetite change, fatigue, fever and unexpected weight change.  HENT:  Negative for congestion, ear pain, rhinorrhea, sinus pressure and sore throat.   Eyes:  Negative for pain, redness and visual disturbance.  Respiratory:  Negative for cough, shortness of breath and wheezing.   Cardiovascular:  Negative for chest pain and palpitations.  Gastrointestinal:  Negative for abdominal pain, blood in stool, constipation and diarrhea.  Endocrine: Negative for polydipsia and polyuria.  Genitourinary:  Negative for dysuria, frequency and urgency.  Musculoskeletal:  Negative for arthralgias,  back pain and myalgias.  Skin:  Negative for pallor and rash.  Allergic/Immunologic: Negative for environmental allergies.  Neurological:  Negative for dizziness, syncope and headaches.  Hematological:  Negative for adenopathy. Does not bruise/bleed easily.  Psychiatric/Behavioral:  Negative for decreased concentration and dysphoric mood. The patient is not nervous/anxious.  Objective:   Physical Exam Constitutional:      General: She is not in acute distress.    Appearance: She is well-developed. She is obese. She is not ill-appearing.  HENT:     Head: Normocephalic and atraumatic.  Eyes:     Conjunctiva/sclera: Conjunctivae normal.     Pupils: Pupils are equal, round, and reactive to light.  Neck:     Thyroid : No thyromegaly.     Vascular: No carotid bruit or JVD.  Cardiovascular:     Rate and Rhythm: Normal rate and regular rhythm.     Heart sounds: Normal heart sounds.     No gallop.  Pulmonary:     Effort: Pulmonary effort is normal. No respiratory distress.     Breath sounds: Normal breath sounds. No wheezing or rales.  Abdominal:     General: There is no distension or abdominal bruit.     Palpations: Abdomen is soft.  Musculoskeletal:     Cervical back: Normal range of motion and neck supple.     Right lower leg: No edema.     Left lower leg: No edema.  Lymphadenopathy:     Cervical: No cervical adenopathy.  Skin:    General: Skin is warm and dry.     Coloration: Skin is not pale.     Findings: No rash.  Neurological:     Mental Status: She is alert.     Coordination: Coordination normal.     Deep Tendon Reflexes: Reflexes are normal and symmetric. Reflexes normal.  Psychiatric:        Mood and Affect: Mood normal.           Assessment & Plan:   Problem List Items Addressed This Visit       Cardiovascular and Mediastinum   Benign essential HTN   bp in fair control at this time  BP Readings from Last 1 Encounters:  03/28/24 112/76   No  changes needed Most recent labs reviewed  Disc lifstyle change with low sodium diet and exercise  Continues chlorthalidone  12.5 mg daily   Commended better health habits        Relevant Orders   Basic metabolic panel with GFR     Other   Prediabetes - Primary   A1c ordered   disc imp of low glycemic diet and wt loss to prevent DM2  Commended on good work and weight loss so far      Relevant Orders   Hemoglobin A1c   Perimenopause   Did not tolerate vaginal us  , seeing gyn  On micronor - having improvement   Also bmd in normal range  Reassuring       Obesity (BMI 30-39.9)   Discussed how this problem influences overall health and the risks it imposes  Reviewed plan for weight loss with lower calorie diet (via better food choices (lower glycemic and portion control) along with exercise building up to or more than 30 minutes 5 days per week including some aerobic activity and strength training    Doing great so far with weight loss Commended       Irregular menses   Saw gyn Dr Glennon  Did not tolerate US    Taking mironor and tolerating that Some improvement in bleeding      Hyperlipidemia   Disc goals for lipids and reasons to control them Rev last labs with pt Rev low sat fat diet in detail  Eating much better  Lab today  Commended  Relevant Orders   Lipid panel

## 2024-03-28 NOTE — Assessment & Plan Note (Signed)
 bp in fair control at this time  BP Readings from Last 1 Encounters:  03/28/24 112/76   No changes needed Most recent labs reviewed  Disc lifstyle change with low sodium diet and exercise  Continues chlorthalidone  12.5 mg daily   Commended better health habits

## 2024-03-28 NOTE — Assessment & Plan Note (Signed)
 Disc goals for lipids and reasons to control them Rev last labs with pt Rev low sat fat diet in detail  Eating much better  Lab today  Commended

## 2024-03-28 NOTE — Assessment & Plan Note (Signed)
 A1c ordered   disc imp of low glycemic diet and wt loss to prevent DM2  Commended on good work and weight loss so far

## 2024-03-31 ENCOUNTER — Inpatient Hospital Stay

## 2024-03-31 ENCOUNTER — Inpatient Hospital Stay: Attending: Hematology and Oncology | Admitting: Hematology and Oncology

## 2024-03-31 VITALS — BP 126/85 | HR 74 | Temp 97.6°F | Resp 14 | Wt 183.1 lb

## 2024-03-31 DIAGNOSIS — I10 Essential (primary) hypertension: Secondary | ICD-10-CM | POA: Diagnosis not present

## 2024-03-31 DIAGNOSIS — K219 Gastro-esophageal reflux disease without esophagitis: Secondary | ICD-10-CM | POA: Diagnosis not present

## 2024-03-31 DIAGNOSIS — D500 Iron deficiency anemia secondary to blood loss (chronic): Secondary | ICD-10-CM | POA: Diagnosis not present

## 2024-03-31 DIAGNOSIS — Z79899 Other long term (current) drug therapy: Secondary | ICD-10-CM | POA: Diagnosis not present

## 2024-03-31 DIAGNOSIS — Z8041 Family history of malignant neoplasm of ovary: Secondary | ICD-10-CM

## 2024-03-31 DIAGNOSIS — Z801 Family history of malignant neoplasm of trachea, bronchus and lung: Secondary | ICD-10-CM

## 2024-03-31 DIAGNOSIS — Z803 Family history of malignant neoplasm of breast: Secondary | ICD-10-CM

## 2024-03-31 DIAGNOSIS — Z8042 Family history of malignant neoplasm of prostate: Secondary | ICD-10-CM

## 2024-03-31 DIAGNOSIS — N939 Abnormal uterine and vaginal bleeding, unspecified: Secondary | ICD-10-CM | POA: Insufficient documentation

## 2024-03-31 LAB — CMP (CANCER CENTER ONLY)
ALT: 11 U/L (ref 0–44)
AST: 19 U/L (ref 15–41)
Albumin: 4.1 g/dL (ref 3.5–5.0)
Alkaline Phosphatase: 64 U/L (ref 38–126)
Anion gap: 6 (ref 5–15)
BUN: 13 mg/dL (ref 6–20)
CO2: 29 mmol/L (ref 22–32)
Calcium: 9.6 mg/dL (ref 8.9–10.3)
Chloride: 102 mmol/L (ref 98–111)
Creatinine: 0.78 mg/dL (ref 0.44–1.00)
GFR, Estimated: >60 mL/min (ref >60)
Glucose, Bld: 92 mg/dL (ref 70–99)
Potassium: 3.8 mmol/L (ref 3.5–5.1)
Sodium: 137 mmol/L (ref 135–145)
Total Bilirubin: 0.4 mg/dL (ref 0.0–1.2)
Total Protein: 7.6 g/dL (ref 6.5–8.1)

## 2024-03-31 LAB — RETIC PANEL
Immature Retic Fract: 10.3 % (ref 2.3–15.9)
RBC.: 4.53 MIL/uL (ref 3.87–5.11)
Retic Count, Absolute: 65.2 K/uL (ref 19.0–186.0)
Retic Ct Pct: 1.4 % (ref 0.4–3.1)
Reticulocyte Hemoglobin: 30.6 pg (ref >27.9)

## 2024-03-31 LAB — CBC WITH DIFFERENTIAL (CANCER CENTER ONLY)
Abs Immature Granulocytes: 0.02 K/uL (ref 0.00–0.07)
Basophils Absolute: 0.1 K/uL (ref 0.0–0.1)
Basophils Relative: 1 %
Eosinophils Absolute: 0.1 K/uL (ref 0.0–0.5)
Eosinophils Relative: 1 %
HCT: 35.4 % — ABNORMAL LOW (ref 36.0–46.0)
Hemoglobin: 11.6 g/dL — ABNORMAL LOW (ref 12.0–15.0)
Immature Granulocytes: 0 %
Lymphocytes Relative: 30 %
Lymphs Abs: 2.4 K/uL (ref 0.7–4.0)
MCH: 25.7 pg — ABNORMAL LOW (ref 26.0–34.0)
MCHC: 32.8 g/dL (ref 30.0–36.0)
MCV: 78.3 fL — ABNORMAL LOW (ref 80.0–100.0)
Monocytes Absolute: 0.7 K/uL (ref 0.1–1.0)
Monocytes Relative: 8 %
Neutro Abs: 4.8 K/uL (ref 1.7–7.7)
Neutrophils Relative %: 60 %
Platelet Count: 332 K/uL (ref 150–400)
RBC: 4.52 MIL/uL (ref 3.87–5.11)
RDW: 15.5 % (ref 11.5–15.5)
WBC Count: 8 K/uL (ref 4.0–10.5)
nRBC: 0 % (ref 0.0–0.2)

## 2024-03-31 LAB — IRON AND IRON BINDING CAPACITY (CC-WL,HP ONLY)
Iron: 47 ug/dL (ref 28–170)
Saturation Ratios: 10 % — ABNORMAL LOW (ref 10.4–31.8)
TIBC: 455 ug/dL — ABNORMAL HIGH (ref 250–450)
UIBC: 408 ug/dL (ref 148–442)

## 2024-03-31 NOTE — Progress Notes (Signed)
 [facility] Telephone:([phone]   [phone]  INITIAL CONSULT NOTE  Patient Care Team: Tower, Laine LABOR, MD as PCP - General (Family Medicine)  Hematological/Oncological History # Iron  Deficiency Anemia 2/2 to GYN Bleeding  01/04/2024: WBC 6.7, Hgb 12.0, MCV 77.1, Plt 340  02/17/2024: Ferritin 7, Iron  sat 12%,  03/31/2024: establish care with Dr. Federico   CHIEF COMPLAINTS/PURPOSE OF CONSULTATION:   Iron  Deficiency Anemia    HISTORY OF PRESENTING ILLNESS:  Ashlee Perry 38 y.o. female with medical history significant for GERD, hypertension, migraine, and heavy menstrual cycles who presents for evaluation of iron  deficiency anemia.   On review of the previous records Ms. Welcome had labs drawn on 01/04/2024 which showed a hemoglobin of 12.0, MCV 77.1, and platelets of 340.  She was started on p.o. iron  therapy.  On 02/17/2024 she had a ferritin of 7 with an iron  sat of 12%.  Due to concern for iron  deficiency anemia she was referred to hematology for further evaluation and management.  On exam today Ms. Demarcus reports that she does have lengthy menstrual cycles and is undergoing a lot of spotting recently.  She reports when her bleeds are happening they generally last for about 7 days.  She has cramps, headaches, and hot flashes.  She think she may be going through perimenopause.  She reports that she does enjoy eating salads and does eat red meat approximate 2-3 times per week.  She does enjoy kale.  She notes that iron  has been making her stools dark but she is not having any bleeding elsewhere such as nosebleeds, gum bleeding, or blood in the urine/stool.  On further discussion she reports there is no family history of any blood diseases or disorders.  Her mother had breast cancer and hypertension and her father has hypertension.  She reports that she has a healthy brother and no children.  Her maternal grandmother also had a low-grade breast cancer at age 63 and died  of a heart attack.  She used to vape and does occasional smoke hookah but no cigarettes.  She reports that she drinks once in a blue moon.  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  She otherwise denies any nausea, vomiting, or diarrhea.  She is not having any fevers.  A full 10 point ROS is otherwise negative.  She is tolerating her iron  pills well with no stomach upset.  MEDICAL HISTORY:  Past Medical History:  Diagnosis Date   History of gastroesophageal reflux (GERD)    Hypertension    Migraine     SURGICAL HISTORY: Past Surgical History:  Procedure Laterality Date   WISDOM TOOTH EXTRACTION  2006    SOCIAL HISTORY: Social History   Socioeconomic History   Marital status: Single    Spouse name: Not on file   Number of children: Not on file   Years of education: Not on file   Highest education level: Some college, no degree  Occupational History   Not on file  Tobacco Use   Smoking status: Former    Types: E-cigarettes   Smokeless tobacco: Never   Tobacco comments:    Former Press photographer status: Some Days   Substances: Nicotine, Flavoring  Substance and Sexual Activity   Alcohol use: Not Currently    Comment: wine-occ   Drug use: Not Currently    Types: Marijuana    Comment: 1-2 times a month  Sexual activity: Not Currently    Partners: Male    Comment: menarche  11yo  Other Topics Concern   Not on file  Social History Narrative   Not on file   Social Drivers of Health   Financial Resource Strain: Low Risk  (03/18/2024)   Overall Financial Resource Strain (CARDIA)    Difficulty of Paying Living Expenses: Not very hard  Food Insecurity: No Food Insecurity (03/18/2024)   Hunger Vital Sign    Worried About Running Out of Food in the Last Year: Never true    Ran Out of Food in the Last Year: Never true  Transportation Needs: No Transportation Needs (03/18/2024)   PRAPARE - Scientist, research (physical sciences) (Medical): No    Lack of Transportation (Non-Medical): No  Physical Activity: Sufficiently Active (03/18/2024)   Exercise Vital Sign    Days of Exercise per Week: 5 days    Minutes of Exercise per Session: 30 min  Stress: No Stress Concern Present (03/18/2024)   [employer] of Occupational Health - Occupational Stress Questionnaire    Feeling of Stress: Not at all  Social Connections: Moderately Integrated (03/18/2024)   Social Connection and Isolation Panel    Frequency of Communication with Friends and Family: More than three times a week    Frequency of Social Gatherings with Friends and Family: Once a week    Attends Religious Services: More than 4 times per year    Active Member of [employer] or Organizations: Yes    Attends Engineer, structural: Not on file    Marital Status: Never married  Intimate Partner Violence: Not on file    FAMILY HISTORY: Family History  Problem Relation Age of Onset   Breast cancer Mother 69   Hypertension Mother    Hypertension Father    Breast cancer Maternal Aunt    Breast cancer Maternal Aunt    Breast cancer Maternal Aunt    Prostate cancer Maternal Uncle    Lung cancer Maternal Uncle    Prostate cancer Paternal Uncle    Diabetes Maternal Grandmother    Breast cancer Maternal Grandmother    Arthritis Maternal Grandfather    Kidney failure Maternal Grandfather    Ovarian cancer Paternal Grandmother        d. 21s   Prostate cancer Paternal Grandfather 28       metastatic    ALLERGIES:  is allergic to amlodipine , bisoprolol , calan  sr [verapamil ], and lisinopril.  MEDICATIONS:  Current Outpatient Medications  Medication Sig Dispense Refill   chlorthalidone  (HYGROTON ) 25 MG tablet TAKE 1/2 TABLET BY MOUTH EVERY DAY 45 tablet 0   fluticasone  (FLONASE ) 50 MCG/ACT nasal spray Place 1 spray into both nostrils daily. 16 g 0   ibuprofen  (ADVIL ) 800 MG tablet Take 1 tablet (800 mg total) by mouth every 8 (eight) hours as  needed. 21 tablet 0   iron  polysaccharides (NIFEREX) 150 MG capsule Take 1 capsule (150 mg total) by mouth daily. 30 capsule 3   lidocaine  (XYLOCAINE ) 2 % jelly Apply 1 Application topically as needed (once with procedure). 30 mL 0   Multiple Vitamins-Minerals (ALIVE WOMENS GUMMY PO) Take 1 tablet by mouth daily.     norethindrone  (MICRONOR ) 0.35 MG tablet Take 1 tablet (0.35 mg total) by mouth daily. 84 tablet 6   omeprazole  (PRILOSEC) 20 MG capsule TAKE 1 CAPSULE (20 MG TOTAL) BY MOUTH 2 (TWO) TIMES DAILY BEFORE A MEAL. 180 capsule 3   SUMAtriptan  (IMITREX ) 100  MG tablet Take 1 tablet (100 mg total) by mouth once for 1 dose. 10 tablet 11   topiramate  (TOPAMAX ) 25 MG tablet Take 1 tablet (25 mg total) by mouth 2 (two) times daily. 180 tablet 3   VITAMIN D  PO Take by mouth.     No current facility-administered medications for this visit.    REVIEW OF SYSTEMS:   Constitutional: ( - ) fevers, ( - )  chills , ( - ) night sweats Eyes: ( - ) blurriness of vision, ( - ) double vision, ( - ) watery eyes Ears, nose, mouth, throat, and face: ( - ) mucositis, ( - ) sore throat Respiratory: ( - ) cough, ( - ) dyspnea, ( - ) wheezes Cardiovascular: ( - ) palpitation, ( - ) chest discomfort, ( - ) lower extremity swelling Gastrointestinal:  ( - ) nausea, ( - ) heartburn, ( - ) change in bowel habits Skin: ( - ) abnormal skin rashes Lymphatics: ( - ) new lymphadenopathy, ( - ) easy bruising Neurological: ( - ) numbness, ( - ) tingling, ( - ) new weaknesses Behavioral/Psych: ( - ) mood change, ( - ) new changes  All other systems were reviewed with the patient and are negative.  PHYSICAL EXAMINATION:  Vitals:   03/31/24 1320  BP: 126/85  Pulse: 74  Resp: 14  Temp: 97.6 F (36.4 C)  SpO2: 97%   Filed Weights   03/31/24 1320  Weight: 183 lb 1.6 oz (83.1 kg)    GENERAL: well appearing middle-age African-American female in NAD  SKIN: skin color, texture, turgor are normal, no rashes or  significant lesions EYES: conjunctiva are pink and non-injected, sclera clear LUNGS: clear to auscultation and percussion with normal breathing effort HEART: regular rate & rhythm and no murmurs and no lower extremity edema Musculoskeletal: no cyanosis of digits and no clubbing  PSYCH: alert & oriented x 3, fluent speech NEURO: no focal motor/sensory deficits  LABORATORY DATA:  I have reviewed the data as listed    Latest Ref Rng & Units 03/31/2024    2:16 PM 02/17/2024    4:45 PM 01/04/2024   12:03 PM  CBC  WBC 4.0 - 10.5 K/uL 8.0   6.7   Hemoglobin 12.0 - 15.0 g/dL 88.3  88.7  87.9   Hematocrit 36.0 - 46.0 % 35.4  37.2  37.3   Platelets 150 - 400 K/uL 332   340.0        Latest Ref Rng & Units 03/28/2024   10:42 AM 11/26/2023   11:45 AM 11/14/2022    9:08 AM  CMP  Glucose 70 - 99 mg/dL 97  93  895   BUN 6 - 23 mg/dL 15  17  17    Creatinine 0.40 - 1.20 mg/dL 9.19  9.21  9.21   Sodium 135 - 145 mEq/L 138  137  138   Potassium 3.5 - 5.1 mEq/L 4.3  4.2  4.3   Chloride 96 - 112 mEq/L 101  99  101   CO2 19 - 32 mEq/L 29  30  29    Calcium 8.4 - 10.5 mg/dL 9.9  89.9  9.9   Total Protein 6.0 - 8.3 g/dL  7.4  7.3   Total Bilirubin 0.2 - 1.2 mg/dL  0.4  0.3   Alkaline Phos 39 - 117 U/L  62  60   AST 0 - 37 U/L  17  21   ALT 0 - 35 U/L  11  10      ASSESSMENT & PLAN Mashelle L Fohl 38 y.o. female with medical history significant for GERD, hypertension, migraine, and heavy menstrual cycles who presents for evaluation of iron  deficiency anemia.   After review of the labs, review of the records, and discussion with the patient the patients findings are most consistent with iron  deficiency anemia in the setting of GYN bleeding.   # Iron  Deficiency Anemia 2/2 to GYN Bleeding -- Findings are consistent with iron  deficiency anemia secondary to patient's menorrhagia --Encouraged her to follow-up with OB/GYN for better control of her menstrual cycles --We will confirm iron  deficiency anemia  by ordering iron  panel and ferritin as well as reticulocytes, CBC, and CMP --Continue ferrous sulfate daily with a source of vitamin C --We will plan to proceed with IV iron  therapy in order to help bolster the patient's blood counts --Plan for return to clinic in 4 to 6 weeks time after last dose of IV iron    Orders Placed This Encounter  Procedures   CBC with Differential (Cancer Center Only)    Standing Status:   Future    Number of Occurrences:   1    Expiration Date:   03/31/2025   CMP (Cancer Center only)    Standing Status:   Future    Number of Occurrences:   1    Expiration Date:   03/31/2025   Ferritin    Standing Status:   Future    Number of Occurrences:   1    Expiration Date:   03/31/2025   Iron  and Iron  Binding Capacity ([facility] only)    Standing Status:   Future    Number of Occurrences:   1    Expiration Date:   03/31/2025   Retic Panel    Standing Status:   Future    Number of Occurrences:   1    Expiration Date:   03/31/2025    All questions were answered. The patient knows to call the clinic with any problems, questions or concerns.  A total of more than 60 minutes were spent on this encounter with face-to-face time and non-face-to-face time, including preparing to see the patient, ordering tests and/or medications, counseling the patient and coordination of care as outlined above.   Norleen IVAR Kidney, MD Department of Hematology/Oncology [facility] Phone: [phone] Pager: [phone] Email: [email or web address]  03/31/2024 3:00 PM

## 2024-04-01 ENCOUNTER — Telehealth: Payer: Self-pay

## 2024-04-01 ENCOUNTER — Other Ambulatory Visit (HOSPITAL_COMMUNITY): Payer: Self-pay | Admitting: Hematology and Oncology

## 2024-04-01 DIAGNOSIS — D509 Iron deficiency anemia, unspecified: Secondary | ICD-10-CM

## 2024-04-01 LAB — FERRITIN: Ferritin: 15 ng/mL (ref 11–307)

## 2024-04-01 NOTE — Telephone Encounter (Signed)
 Auth Submission: NO AUTH NEEDED Site of care: Site of care: [facility] Payer: BCBS FEP Medication & CPT/J Code(s) submitted: Monoferric (Ferrci derisomaltose) [phone] Diagnosis Code:  Route of submission (phone, fax, portal): phone Phone # [phone] Fax # Auth type: Buy/Bill PB Units/visits requested: 1000mg  x 1 dose Reference number: [id number] Approval from: 04/01/24 to 08/01/24

## 2024-04-05 ENCOUNTER — Ambulatory Visit: Admitting: Obstetrics and Gynecology

## 2024-04-05 ENCOUNTER — Other Ambulatory Visit: Payer: Self-pay | Admitting: Family Medicine

## 2024-04-18 ENCOUNTER — Ambulatory Visit: Admitting: Gastroenterology

## 2024-04-18 NOTE — Progress Notes (Deleted)
 Ashlee Perry

## 2024-06-09 ENCOUNTER — Ambulatory Visit: Admitting: Gastroenterology

## 2024-06-09 ENCOUNTER — Encounter: Payer: Self-pay | Admitting: Gastroenterology

## 2024-06-09 VITALS — BP 110/70 | HR 83 | Ht 64.5 in | Wt 184.0 lb

## 2024-06-09 DIAGNOSIS — D509 Iron deficiency anemia, unspecified: Secondary | ICD-10-CM

## 2024-06-09 DIAGNOSIS — K219 Gastro-esophageal reflux disease without esophagitis: Secondary | ICD-10-CM | POA: Diagnosis not present

## 2024-06-09 MED ORDER — NA SULFATE-K SULFATE-MG SULF 17.5-3.13-1.6 GM/177ML PO SOLN: 1.0000 | Freq: Once | ORAL | 0 refills | Status: AC

## 2024-06-09 NOTE — Progress Notes (Signed)
 Chief Complaint:Other iron  deficiency anemia  Primary GI Doctor:Dr. Avram  HPI:  Patient is a  38  year old female patient with past medical history of GERD, hypertension, migraine, and heavy menstrual cycles, who was referred to me by Randeen Laine LABOR, MD on 02/17/24 for a evaluation of other iron  deficiency anemia  .    03/31/24 seen by hematology Dr Federico for Iron  deficiency anemia. Noted IDA Ashlee Perry be related to patient's menorrhagia. Encouraged her to follow-up with OB/GYN for better control of her menstrual cycles. Reviewed entire note.  02/17/24 per GYN/OB recommend early colonoscopy for only spotting and having anemia.  Interval History Patient presents for evaluation of iron  deficiency anemia. Patient reports she has irregular menstrual cycles, She reports in August she had heavy cycle with clots, skipped her period in September, and now only with some spotting. She has been taking oral iron  since April.  Patient is not vegan and consumes red meat . Does not donate blood. She notes dark stool since starting iron .  She reports mild constipation due to iron  so she increased her daily fiber and drinks plenty of water which helps.   She has history of GERD and controlled with omeprazole .  Patient denies dysphagia.Patient denies nausea, vomiting, or weight loss.  Socially drinks wine. Nonsmoker.    She uses ibuprofen  prn, very seldom.   Never had EGD/colon.   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  Patient's family history includes: mother with breast CA, maternal grandmother with ovarian CA  Wt Readings from Last 3 Encounters:  06/09/24 184 lb (83.5 kg)  03/31/24 183 lb 1.6 oz (83.1 kg)  03/28/24 179 lb (81.2 kg)     Past Medical History:  Diagnosis Date   Anemia    History of gastroesophageal reflux (GERD)    Hypertension    Migraine     Past Surgical History:  Procedure Laterality Date   WISDOM TOOTH EXTRACTION  2006    Current  Outpatient Medications  Medication Sig Dispense Refill   chlorthalidone  (HYGROTON ) 25 MG tablet TAKE 1/2 TABLET BY MOUTH EVERY DAY 45 tablet 1   fluticasone  (FLONASE ) 50 MCG/ACT nasal spray Place 1 spray into both nostrils daily. 16 g 0   ibuprofen  (ADVIL ) 800 MG tablet Take 1 tablet (800 mg total) by mouth every 8 (eight) hours as needed. 21 tablet 0   iron  polysaccharides (NIFEREX) 150 MG capsule TAKE 1 CAPSULE BY MOUTH EVERY DAY 90 capsule 1   lidocaine  (XYLOCAINE ) 2 % jelly Apply 1 Application topically as needed (once with procedure). 30 mL 0   Multiple Vitamins-Minerals (ALIVE WOMENS GUMMY PO) Take 1 tablet by mouth daily.     norethindrone  (MICRONOR ) 0.35 MG tablet Take 1 tablet (0.35 mg total) by mouth daily. 84 tablet 6   omeprazole  (PRILOSEC) 20 MG capsule TAKE 1 CAPSULE (20 MG TOTAL) BY MOUTH 2 (TWO) TIMES DAILY BEFORE A MEAL. 180 capsule 3   SUMAtriptan  (IMITREX ) 100 MG tablet Take 1 tablet (100 mg total) by mouth once for 1 dose. 10 tablet 11   topiramate  (TOPAMAX ) 25 MG tablet Take 1 tablet (25 mg total) by mouth 2 (two) times daily. 180 tablet 3   VITAMIN D  PO Take by mouth.     No current facility-administered medications for this visit.    Allergies as of 06/09/2024 - Review Complete 06/09/2024  Allergen Reaction Noted   Amlodipine  Other (See Comments) 11/02/2020   Bisoprolol   12/10/2020   Calan   sr [verapamil ]  12/10/2020   Lisinopril  10/24/2020    Family History  Problem Relation Age of Onset   Breast cancer Mother 22   Hypertension Mother    Hypertension Father    Diabetes Maternal Grandmother    Breast cancer Maternal Grandmother    Arthritis Maternal Grandfather    Kidney failure Maternal Grandfather    Ovarian cancer Paternal Grandmother        d. 75s   Prostate cancer Paternal Grandfather 29       metastatic   Breast cancer Maternal Aunt    Breast cancer Maternal Aunt    Breast cancer Maternal Aunt    Prostate cancer Maternal Uncle    Lung cancer  Maternal Uncle        1/2 uncle, smoker, drinker   Esophageal cancer Maternal Uncle    Prostate cancer Paternal Uncle    Colon cancer Neg Hx     Review of Systems:    Constitutional: No weight loss, fever, chills, weakness or fatigue HEENT: Eyes: No change in vision               Ears, Nose, Throat:  No change in hearing or congestion Skin: No rash or itching Cardiovascular: No chest pain, chest pressure or palpitations   Respiratory: No SOB or cough Gastrointestinal: See HPI and otherwise negative Genitourinary: No dysuria or change in urinary frequency Neurological: No headache, dizziness or syncope Musculoskeletal: No new muscle or joint pain Hematologic: No bleeding or bruising Psychiatric: No history of depression or anxiety    Physical Exam:  Vital signs: BP 110/70   Pulse 83   Ht 5' 4.5 (1.638 m)   Wt 184 lb (83.5 kg)   BMI 31.10 kg/m   Constitutional:   Pleasant female appears to be in NAD, Well developed, Well nourished, alert and cooperative Throat: Oral cavity and pharynx without inflammation, swelling or lesion.  Respiratory: Respirations even and unlabored. Lungs clear to auscultation bilaterally.   No wheezes, crackles, or rhonchi.  Cardiovascular: Normal S1, S2. Regular rate and rhythm. No peripheral edema, cyanosis or pallor.  Gastrointestinal:  Soft, nondistended, nontender. No rebound or guarding. Normal bowel sounds. No appreciable masses or hepatomegaly. Rectal:  Not performed.  Msk:  Symmetrical without gross deformities. Without edema, no deformity or joint abnormality.  Neurologic:  Alert and  oriented x4;  grossly normal neurologically.  Skin:   Dry and intact without significant lesions or rashes.  RELEVANT LABS AND IMAGING: CBC    Latest Ref Rng & Units 03/31/2024    2:16 PM 02/17/2024    4:45 PM 01/04/2024   12:03 PM  CBC  WBC 4.0 - 10.5 K/uL 8.0   6.7   Hemoglobin 12.0 - 15.0 g/dL 88.3  88.7  87.9   Hematocrit 36.0 - 46.0 % 35.4  37.2  37.3    Platelets 150 - 400 K/uL 332   340.0      CMP     Latest Ref Rng & Units 03/31/2024    2:16 PM 03/28/2024   10:42 AM 11/26/2023   11:45 AM  CMP  Glucose 70 - 99 mg/dL 92  97  93   BUN 6 - 20 mg/dL 13  15  17    Creatinine 0.44 - 1.00 mg/dL 9.21  9.19  9.21   Sodium 135 - 145 mmol/L 137  138  137   Potassium 3.5 - 5.1 mmol/L 3.8  4.3  4.2   Chloride 98 - 111 mmol/L 102  101  99   CO2 22 - 32 mmol/L 29  29  30    Calcium 8.9 - 10.3 mg/dL 9.6  9.9  89.9   Total Protein 6.5 - 8.1 g/dL 7.6   7.4   Total Bilirubin 0.0 - 1.2 mg/dL 0.4   0.4   Alkaline Phos 38 - 126 U/L 64   62   AST 15 - 41 U/L 19   17   ALT 0 - 44 U/L 11   11      Lab Results  Component Value Date   TSH 2.07 11/26/2023   Lab Results  Component Value Date   IRON  47 03/31/2024   TIBC 455 (H) 03/31/2024   FERRITIN 15 03/31/2024      Assessment/Plan: Encounter Diagnoses  Name Primary?   Iron  deficiency anemia, unspecified iron  deficiency anemia type Yes   Gastroesophageal reflux disease, unspecified whether esophagitis present        38 year old female patient that presents for iron  deficiency anemia.  Patient recently seen and evaluated by both OB/GYN and oncology.  Patient does have history of irregular menstrual cycles however OB/GYN felt it was not related and referred here with recommendations for endoscopic procedures.  Patient has been on oral iron  since April.  Most recent Hgb 11.6. Iron  47 and ferritin 15. Occasional NSAID use. History of GERD, on PPI.  Will go ahead and proceed with upper GI endoscopy and colonoscopy to evaluate for source of anemia.  Patient will continue to follow-up with hematology as scheduled. -continue omeprazole  20 mg po daily -Recommend GERD diet -No NSAIDs -continue iron  supplement po daily -Continue follow-up with hematology as scheduled -Schedule EGD in [facility] with Dr. Avram. The risks and benefits of EGD with possible biopsies and esophageal dilation were discussed with the  patient who agrees to proceed. -Schedule for a colonoscopy in [facility] with Dr. Avram. The risks and benefits of colonoscopy with possible polypectomy / biopsies were discussed and the patient agrees to proceed.   Thank you for the courtesy of this consult. Please call me with any questions or concerns.   Aidian Salomon, FNP-C [facility] 06/09/2024, 12:21 PM  Cc: Tower, Laine LABOR, MD

## 2024-06-09 NOTE — Patient Instructions (Addendum)
 Constipation OTC Miralax po daily - take whole week of procedures to make sure prep is good High fiber diet Drink plenty of water   GERD  continue omeprazole  Recommend GERD diet  We have sent the following medications to your pharmacy for you to pick up at your convenience: SUPREP  You have been scheduled for an endoscopy and colonoscopy. Please follow the written instructions given to you at your visit today.  If you use inhalers (even only as needed), please bring them with you on the day of your procedure.  DO NOT TAKE 7 DAYS PRIOR TO TEST- Trulicity (dulaglutide) Ozempic, Wegovy (semaglutide) Mounjaro (tirzepatide) Bydureon Bcise (exanatide extended release)  DO NOT TAKE 1 DAY PRIOR TO YOUR TEST Rybelsus (semaglutide) Adlyxin (lixisenatide) Victoza (liraglutide) Byetta (exanatide) ___________________________________________________________________________ Due to recent changes in healthcare laws, you may see the results of your imaging and laboratory studies on MyChart before your provider has had a chance to review them.  We understand that in some cases there may be results that are confusing or concerning to you. Not all laboratory results come back in the same time frame and the provider may be waiting for multiple results in order to interpret others.  Please give us  48 hours in order for your provider to thoroughly review all the results before contacting the office for clarification of your results.   _______________________________________________________  If your blood pressure at your visit was 140/90 or greater, please contact your primary care physician to follow up on this.  _______________________________________________________  If you are age 40 or older, your body mass index should be between 23-30. Your Body mass index is 31.1 kg/m. If this is out of the aforementioned range listed, please consider follow up with your Primary Care Provider.  If you are age  61 or younger, your body mass index should be between 19-25. Your Body mass index is 31.1 kg/m. If this is out of the aformentioned range listed, please consider follow up with your Primary Care Provider.   ________________________________________________________  The [facility] providers would like to encourage you to use MYCHART to communicate with providers for non-urgent requests or questions.  Due to long hold times on the telephone, sending your provider a message by [facility] may be a faster and more efficient way to get a response.  Please allow 48 business hours for a response.  Please remember that this is for non-urgent requests.  _______________________________________________________  [facility] is using a team-based approach to care.  Your team is made up of your doctor and two to three APPS. Our APPS (Nurse Practitioners and Physician Assistants) work with your physician to ensure care continuity for you. They are fully qualified to address your health concerns and develop a treatment plan. They communicate directly with your gastroenterologist to care for you. Seeing the Advanced Practice Practitioners on your physician's team can help you by facilitating care more promptly, often allowing for earlier appointments, access to diagnostic testing, procedures, and other specialty referrals.   Thank you for trusting me with your gastrointestinal care. Deanna May, FNP-C

## 2024-06-16 ENCOUNTER — Other Ambulatory Visit (HOSPITAL_COMMUNITY)
Admission: RE | Admit: 2024-06-16 | Discharge: 2024-06-16 | Disposition: A | Discharge status: Home / Self Care | Source: Ambulatory Visit | Attending: Obstetrics and Gynecology | Admitting: Obstetrics and Gynecology

## 2024-06-16 ENCOUNTER — Other Ambulatory Visit: Payer: Self-pay | Admitting: Obstetrics and Gynecology

## 2024-06-16 ENCOUNTER — Encounter: Payer: Self-pay | Admitting: Obstetrics and Gynecology

## 2024-06-16 ENCOUNTER — Ambulatory Visit (INDEPENDENT_AMBULATORY_CARE_PROVIDER_SITE_OTHER): Admitting: Obstetrics and Gynecology

## 2024-06-16 VITALS — BP 122/80 | HR 83 | Ht 64.17 in | Wt 184.6 lb

## 2024-06-16 DIAGNOSIS — Z01419 Encounter for gynecological examination (general) (routine) without abnormal findings: Secondary | ICD-10-CM

## 2024-06-16 DIAGNOSIS — N921 Excessive and frequent menstruation with irregular cycle: Secondary | ICD-10-CM | POA: Diagnosis not present

## 2024-06-16 DIAGNOSIS — Z1331 Encounter for screening for depression: Secondary | ICD-10-CM | POA: Diagnosis not present

## 2024-06-16 NOTE — Progress Notes (Signed)
 38 y.o. y.o. female here for annual exam. Patient's last menstrual period was 04/13/2024 (within days).   Period was heavy with clots in August and was on vacation and couldn't swim due to it being so heavy. Exams are difficult for patient  CHTN. No HRT, no ocp's. Would like to try the micronor .  Did not fill it last year Hot flashes since early to mid 30's. Does not feel like herself. Some irritability, hair loss. No insomnia or joint pain. Reports fatigue but feels better on iron  every other day. Has constipation on it.  Hormone panel today Dxa: likely early menopause to get baseline 03/15/24 normal Last mammogram: 12/21/23 Last colonoscopy: sent for early dx colonoscopy for only spotting and having anemia Could not tolerate PUS Abdominal could not be done without full bladder Gardesil: none. Offered today  Body mass index is 31.52 kg/m.     03/31/2024    1:28 PM 03/28/2024   10:13 AM 12/03/2023   12:34 PM  Depression screen PHQ 2/9  Decreased Interest 0 0 0  Down, Depressed, Hopeless 0 0 0  PHQ - 2 Score 0 0 0  Altered sleeping   0  Tired, decreased energy   3  Change in appetite   1  Feeling bad or failure about yourself    0  Trouble concentrating   0  Moving slowly or fidgety/restless   0  Suicidal thoughts   0  PHQ-9 Score   4    Blood pressure 122/80, pulse 83, height 5' 4.17 (1.63 m), weight 184 lb 9.6 oz (83.7 kg), last menstrual period 04/13/2024, SpO2 97%.     Component Value Date/Time   DIAGPAP  09/28/2019 1213    - Negative for intraepithelial lesion or malignancy (NILM)   DIAGPAP  12/31/2016 0000    NEGATIVE FOR INTRAEPITHELIAL LESIONS OR MALIGNANCY.   HPVHIGH Negative 09/28/2019 1213   ADEQPAP  09/28/2019 1213    Satisfactory for evaluation; transformation zone component PRESENT.   ADEQPAP  12/31/2016 0000    Satisfactory for evaluation  endocervical/transformation zone component PRESENT.    GYN HISTORY:    Component Value Date/Time   DIAGPAP   09/28/2019 1213    - Negative for intraepithelial lesion or malignancy (NILM)   DIAGPAP  12/31/2016 0000    NEGATIVE FOR INTRAEPITHELIAL LESIONS OR MALIGNANCY.   HPVHIGH Negative 09/28/2019 1213   ADEQPAP  09/28/2019 1213    Satisfactory for evaluation; transformation zone component PRESENT.   ADEQPAP  12/31/2016 0000    Satisfactory for evaluation  endocervical/transformation zone component PRESENT.    OB History  Gravida Para Term Preterm AB Living  0 0 0 0 0 0  SAB IAB Ectopic Multiple Live Births  0 0 0 0 0    Past Medical History:  Diagnosis Date   Anemia    History of gastroesophageal reflux (GERD)    Hypertension    Migraine     Past Surgical History:  Procedure Laterality Date   WISDOM TOOTH EXTRACTION  2006    Current Outpatient Medications on File Prior to Visit  Medication Sig Dispense Refill   chlorthalidone  (HYGROTON ) 25 MG tablet TAKE 1/2 TABLET BY MOUTH EVERY DAY 45 tablet 1   fluticasone  (FLONASE ) 50 MCG/ACT nasal spray Place 1 spray into both nostrils daily. 16 g 0   ibuprofen  (ADVIL ) 800 MG tablet Take 1 tablet (800 mg total) by mouth every 8 (eight) hours as needed. 21 tablet 0   iron  polysaccharides (NIFEREX) 150  MG capsule TAKE 1 CAPSULE BY MOUTH EVERY DAY 90 capsule 1   Multiple Vitamins-Minerals (ALIVE WOMENS GUMMY PO) Take 1 tablet by mouth daily.     omeprazole  (PRILOSEC) 20 MG capsule TAKE 1 CAPSULE (20 MG TOTAL) BY MOUTH 2 (TWO) TIMES DAILY BEFORE A MEAL. 180 capsule 3   SUMAtriptan  (IMITREX ) 100 MG tablet Take 1 tablet (100 mg total) by mouth once for 1 dose. 10 tablet 11   topiramate  (TOPAMAX ) 25 MG tablet Take 1 tablet (25 mg total) by mouth 2 (two) times daily. 180 tablet 3   VITAMIN D  PO Take by mouth.     lidocaine  (XYLOCAINE ) 2 % jelly Apply 1 Application topically as needed (once with procedure). (Patient not taking: Reported on 06/16/2024) 30 mL 0   norethindrone  (MICRONOR ) 0.35 MG tablet Take 1 tablet (0.35 mg total) by mouth daily.  (Patient not taking: Reported on 06/16/2024) 84 tablet 6   No current facility-administered medications on file prior to visit.    Social History   Socioeconomic History   Marital status: Single    Spouse name: Not on file   Number of children: 0   Years of education: Not on file   Highest education level: Some college, no degree  Occupational History   Not on file  Tobacco Use   Smoking status: Former    Types: E-cigarettes   Smokeless tobacco: Never   Tobacco comments:    Former Press photographer status: Former   Substances: Nicotine, Flavoring  Substance and Sexual Activity   Alcohol use: Yes    Comment: wine-occ   Drug use: Not Currently    Types: Marijuana    Comment: 1-2 times a month   Sexual activity: Not Currently    Partners: Male    Comment: menarche  11yo  Other Topics Concern   Not on file  Social History Narrative   Not on file   Social Drivers of Health   Financial Resource Strain: Low Risk  (03/18/2024)   Overall Financial Resource Strain (CARDIA)    Difficulty of Paying Living Expenses: Not very hard  Food Insecurity: No Food Insecurity (03/31/2024)   Hunger Vital Sign    Worried About Running Out of Food in the Last Year: Never true    Ran Out of Food in the Last Year: Never true  Transportation Needs: No Transportation Needs (03/31/2024)   PRAPARE - Administrator, Civil Service (Medical): No    Lack of Transportation (Non-Medical): No  Physical Activity: Sufficiently Active (03/18/2024)   Exercise Vital Sign    Days of Exercise per Week: 5 days    Minutes of Exercise per Session: 30 min  Stress: No Stress Concern Present (03/18/2024)   [employer] of Occupational Health - Occupational Stress Questionnaire    Feeling of Stress: Not at all  Social Connections: Moderately Integrated (03/18/2024)   Social Connection and Isolation Panel    Frequency of Communication with Friends and Family: More than three times a week     Frequency of Social Gatherings with Friends and Family: Once a week    Attends Religious Services: More than 4 times per year    Active Member of [employer] or Organizations: Yes    Attends Banker Meetings: Not on file    Marital Status: Never married  Intimate Partner Violence: Not At Risk (03/31/2024)   Humiliation, Afraid, Rape, and Kick questionnaire    Fear of Current or Ex-Partner: No  Emotionally Abused: No    Physically Abused: No    Sexually Abused: No    Family History  Problem Relation Age of Onset   Breast cancer Mother 45   Hypertension Mother    Hypertension Father    Diabetes Maternal Grandmother    Breast cancer Maternal Grandmother    Arthritis Maternal Grandfather    Kidney failure Maternal Grandfather    Ovarian cancer Paternal Grandmother        d. 9s   Prostate cancer Paternal Grandfather 73       metastatic   Breast cancer Maternal Aunt    Breast cancer Maternal Aunt    Breast cancer Maternal Aunt    Prostate cancer Maternal Uncle    Lung cancer Maternal Uncle        1/2 uncle, smoker, drinker   Esophageal cancer Maternal Uncle    Prostate cancer Paternal Uncle    Colon cancer Neg Hx      Allergies  Allergen Reactions   Amlodipine  Other (See Comments)    Migraine, light/noise sensitivity, eye/facial pain   Bisoprolol      Chest pressure    Calan  Sr [Verapamil ]     Constipation     Lisinopril     Hair loss and cough       Patient's last menstrual period was Patient's last menstrual period was 04/13/2024 (within days)..            Review of Systems Alls systems reviewed and are negative.     Physical Exam Constitutional:      Appearance: Normal appearance.  Genitourinary:     Vulva and urethral meatus normal.     No lesions in the vagina.     Right Labia: No rash, lesions or skin changes.    Left Labia: No lesions, skin changes or rash.    No vaginal discharge or tenderness.     No vaginal prolapse present.    No  vaginal atrophy present.     Right Adnexa: not tender, not palpable and no mass present.    Left Adnexa: not tender, not palpable and no mass present.    No cervical motion tenderness or discharge.     Uterus is not enlarged, tender or irregular.  Breasts:    Right: Normal.     Left: Normal.  HENT:     Head: Normocephalic.  Neck:     Thyroid : No thyroid  mass, thyromegaly or thyroid  tenderness.  Cardiovascular:     Rate and Rhythm: Normal rate and regular rhythm.     Heart sounds: Normal heart sounds, S1 normal and S2 normal.  Pulmonary:     Effort: Pulmonary effort is normal.     Breath sounds: Normal breath sounds and air entry.  Abdominal:     General: There is no distension.     Palpations: Abdomen is soft. There is no mass.     Tenderness: There is no abdominal tenderness. There is no guarding or rebound.  Musculoskeletal:        General: Normal range of motion.     Cervical back: Full passive range of motion without pain, normal range of motion and neck supple. No tenderness.     Right lower leg: No edema.     Left lower leg: No edema.  Neurological:     Mental Status: She is alert.  Skin:    General: Skin is warm.  Psychiatric:        Mood and Affect: Mood normal.  Behavior: Behavior normal.        Thought Content: Thought content normal.  Vitals and nursing note reviewed. Exam conducted with a chaperone present.       A:         Well Woman GYN exam Menorrhagia Anemia Could not tolerate PUS                              P:        Pap smear collected today Encouraged annual mammogram screening  Labs and immunizations ordered today Discussed options again with the heavy bleeding discussed hormones vs. RLH since she is not planning on conception. She will try micronor  for now Encouraged healthy lifestyle practices   No follow-ups on file.  Almarie MARLA Carpen

## 2024-06-17 ENCOUNTER — Ambulatory Visit: Payer: Self-pay | Admitting: Obstetrics and Gynecology

## 2024-06-17 LAB — SURESWAB® ADVANCED VAGINITIS PLUS,TMA
C. trachomatis RNA, TMA: NOT DETECTED
CANDIDA SPECIES: NOT DETECTED
Candida glabrata: NOT DETECTED
N. gonorrhoeae RNA, TMA: NOT DETECTED
SURESWAB(R) ADV BACTERIAL VAGINOSIS(BV),TMA: NEGATIVE
TRICHOMONAS VAGINALIS (TV),TMA: NOT DETECTED

## 2024-06-17 LAB — IRON,TIBC AND FERRITIN PANEL
%SAT: 20 % (ref 16–45)
Ferritin: 22 ng/mL (ref 16–154)
Iron: 72 ug/dL (ref 40–190)
TIBC: 359 ug/dL (ref 250–450)

## 2024-06-17 LAB — TSH: TSH: 1.86 m[IU]/L

## 2024-06-17 LAB — CBC
HCT: 40.4 % (ref 35.0–45.0)
Hemoglobin: 12.9 g/dL (ref 11.7–15.5)
MCH: 27.5 pg (ref 27.0–33.0)
MCHC: 31.9 g/dL — ABNORMAL LOW (ref 32.0–36.0)
MCV: 86.1 fL (ref 80.0–100.0)
MPV: 11 fL (ref 7.5–12.5)
Platelets: 340 Thousand/uL (ref 140–400)
RBC: 4.69 Million/uL (ref 3.80–5.10)
RDW: 14.3 % (ref 11.0–15.0)
WBC: 7.4 Thousand/uL (ref 3.8–10.8)

## 2024-06-17 LAB — VITAMIN D 25 HYDROXY (VIT D DEFICIENCY, FRACTURES): Vit D, 25-Hydroxy: 66 ng/mL (ref 30–100)

## 2024-06-17 LAB — FOLLICLE STIMULATING HORMONE: FSH: 6.2 m[IU]/mL

## 2024-06-21 LAB — CYTOLOGY - PAP: Diagnosis: NEGATIVE

## 2024-06-23 ENCOUNTER — Encounter: Payer: Self-pay | Admitting: Internal Medicine

## 2024-06-24 ENCOUNTER — Inpatient Hospital Stay: Attending: Hematology and Oncology

## 2024-06-24 ENCOUNTER — Other Ambulatory Visit: Payer: Self-pay

## 2024-06-24 DIAGNOSIS — D500 Iron deficiency anemia secondary to blood loss (chronic): Secondary | ICD-10-CM | POA: Diagnosis not present

## 2024-06-24 DIAGNOSIS — N939 Abnormal uterine and vaginal bleeding, unspecified: Secondary | ICD-10-CM | POA: Diagnosis not present

## 2024-06-24 LAB — RETIC PANEL
Immature Retic Fract: 7.9 % (ref 2.3–15.9)
RBC.: 4.69 MIL/uL (ref 3.87–5.11)
Retic Count, Absolute: 89.1 K/uL (ref 19.0–186.0)
Retic Ct Pct: 1.9 % (ref 0.4–3.1)
Reticulocyte Hemoglobin: 32.2 pg (ref >27.9)

## 2024-06-24 LAB — CBC WITH DIFFERENTIAL (CANCER CENTER ONLY)
Abs Immature Granulocytes: 0.01 K/uL (ref 0.00–0.07)
Basophils Absolute: 0.1 K/uL (ref 0.0–0.1)
Basophils Relative: 1 %
Eosinophils Absolute: 0.1 K/uL (ref 0.0–0.5)
Eosinophils Relative: 1 %
HCT: 38.4 % (ref 36.0–46.0)
Hemoglobin: 13 g/dL (ref 12.0–15.0)
Immature Granulocytes: 0 %
Lymphocytes Relative: 30 %
Lymphs Abs: 2.2 K/uL (ref 0.7–4.0)
MCH: 27.2 pg (ref 26.0–34.0)
MCHC: 33.9 g/dL (ref 30.0–36.0)
MCV: 80.3 fL (ref 80.0–100.0)
Monocytes Absolute: 0.6 K/uL (ref 0.1–1.0)
Monocytes Relative: 7 %
Neutro Abs: 4.6 K/uL (ref 1.7–7.7)
Neutrophils Relative %: 61 %
Platelet Count: 326 K/uL (ref 150–400)
RBC: 4.78 MIL/uL (ref 3.87–5.11)
RDW: 14.3 % (ref 11.5–15.5)
WBC Count: 7.5 K/uL (ref 4.0–10.5)
nRBC: 0 % (ref 0.0–0.2)

## 2024-06-24 LAB — CMP (CANCER CENTER ONLY)
ALT: 9 U/L (ref 0–44)
AST: 18 U/L (ref 15–41)
Albumin: 4.2 g/dL (ref 3.5–5.0)
Alkaline Phosphatase: 58 U/L (ref 38–126)
Anion gap: 4 — ABNORMAL LOW (ref 5–15)
BUN: 14 mg/dL (ref 6–20)
CO2: 29 mmol/L (ref 22–32)
Calcium: 9.7 mg/dL (ref 8.9–10.3)
Chloride: 104 mmol/L (ref 98–111)
Creatinine: 0.78 mg/dL (ref 0.44–1.00)
GFR, Estimated: >60 mL/min (ref >60)
Glucose, Bld: 101 mg/dL — ABNORMAL HIGH (ref 70–99)
Potassium: 4.1 mmol/L (ref 3.5–5.1)
Sodium: 137 mmol/L (ref 135–145)
Total Bilirubin: 0.6 mg/dL (ref 0.0–1.2)
Total Protein: 7.4 g/dL (ref 6.5–8.1)

## 2024-06-24 LAB — FERRITIN: Ferritin: 32 ng/mL (ref 11–307)

## 2024-06-24 LAB — IRON AND IRON BINDING CAPACITY (CC-WL,HP ONLY)
Iron: 91 ug/dL (ref 28–170)
Saturation Ratios: 24 % (ref 10.4–31.8)
TIBC: 384 ug/dL (ref 250–450)
UIBC: 293 ug/dL (ref 148–442)

## 2024-06-30 ENCOUNTER — Encounter: Payer: Self-pay | Admitting: Internal Medicine

## 2024-06-30 ENCOUNTER — Ambulatory Visit (AMBULATORY_SURGERY_CENTER): Admitting: Internal Medicine

## 2024-06-30 VITALS — BP 121/82 | HR 53 | Temp 97.7°F | Resp 18 | Ht 64.5 in | Wt 184.0 lb

## 2024-06-30 DIAGNOSIS — K219 Gastro-esophageal reflux disease without esophagitis: Secondary | ICD-10-CM

## 2024-06-30 DIAGNOSIS — D509 Iron deficiency anemia, unspecified: Secondary | ICD-10-CM | POA: Diagnosis not present

## 2024-06-30 DIAGNOSIS — K319 Disease of stomach and duodenum, unspecified: Secondary | ICD-10-CM

## 2024-06-30 DIAGNOSIS — K3189 Other diseases of stomach and duodenum: Secondary | ICD-10-CM | POA: Diagnosis not present

## 2024-06-30 MED ORDER — SODIUM CHLORIDE 0.9 % IV SOLN: 500.0000 mL | Freq: Once | INTRAVENOUS | Status: DC

## 2024-06-30 NOTE — Progress Notes (Signed)
1414 Robinul 0.1 mg IV given due large amount of secretions upon assessment.  MD made aware, vss 

## 2024-06-30 NOTE — Op Note (Signed)
 [facility] Patient Name: Ashlee Perry Procedure Date: 06/30/2024 2:14 PM MRN: [id number] Endoscopist: Lupita FORBES Commander , MD, [id number] Age: 38 Referring MD:  Date of Birth: [birth year] Gender: Female Account #: [id number] Procedure:                Upper GI endoscopy Indications:              Iron  deficiency anemia Medicines:                Monitored Anesthesia Care Procedure:                Pre-Anesthesia Assessment:                           - Prior to the procedure, a History and Physical                            was performed, and patient medications and                            allergies were reviewed. The patient's tolerance of                            previous anesthesia was also reviewed. The risks                            and benefits of the procedure and the sedation                            options and risks were discussed with the patient.                            All questions were answered, and informed consent                            was obtained. Prior Anticoagulants: The patient has                            taken no anticoagulant or antiplatelet agents. ASA                            Grade Assessment: II - A patient with mild systemic                            disease. After reviewing the risks and benefits,                            the patient was deemed in satisfactory condition to                            undergo the procedure.                           After obtaining informed consent, the endoscope was  passed under direct vision. Throughout the                            procedure, the patient's blood pressure, pulse, and                            oxygen saturations were monitored continuously. The                            Olympus scope [id number] was introduced through the                            mouth, and advanced to the second part of duodenum.                            The upper GI endoscopy was  accomplished without                            difficulty. The patient tolerated the procedure                            well. Scope In: Scope Out: Findings:                 Patchy mildly erythematous mucosa without bleeding                            was found in the gastric antrum. Biopsies were                            taken with a cold forceps for histology.                            Verification of patient identification for the                            specimen was done. Estimated blood loss was minimal.                           The exam was otherwise without abnormality.                           The cardia and gastric fundus were normal on                            retroflexion. Complications:            No immediate complications. Estimated Blood Loss:     Estimated blood loss was minimal. Impression:               - Erythematous mucosa in the antrum. Biopsied.                           - The examination was otherwise normal. Recommendation:           - Patient has a contact number available for  emergencies. The signs and symptoms of potential                            delayed complications were discussed with the                            patient. Return to normal activities tomorrow.                            Written discharge instructions were provided to the                            patient.                           - Resume previous diet.                           - Continue present medications.                           - Await pathology results.                           - See the other procedure note for documentation of                            additional recommendations. Lupita FORBES Commander, MD 06/30/2024 3:01:08 PM This report has been signed electronically.

## 2024-06-30 NOTE — Progress Notes (Signed)
 Called to room to assist during endoscopic procedure.  Patient ID and intended procedure confirmed with present staff. Received instructions for my participation in the procedure from the performing physician.

## 2024-06-30 NOTE — Progress Notes (Signed)
 History and Physical Interval Note:  06/30/2024 2:23 PM  Ashlee Perry  has presented today for endoscopic procedure(s), with the diagnosis of  Encounter Diagnoses  Name Primary?   Iron  deficiency anemia, unspecified iron  deficiency anemia type Yes   Gastroesophageal reflux disease, unspecified whether esophagitis present   .  The various methods of evaluation and treatment have been discussed with the patient and/or family. After consideration of risks, benefits and other options for treatment, the patient has consented to  the endoscopic procedure(s).   The patient's history has been reviewed, patient examined, no change in status, stable for endoscopic procedure(s).  I have reviewed the patient's chart and labs.  Questions were answered to the patient's satisfaction.     Lupita CHARLENA Commander, MD, [facility]

## 2024-06-30 NOTE — Patient Instructions (Addendum)
 Nothing bad going on.  I did not see a source of blood loss, low iron .    Colonoscopy was entirely normal.  There was some red color change in the stomach but could represent inflammation/infection so I took biopsies of that.  I will let you know those results.  Please continue your iron  supplements and follow-up with primary care and gynecology.    I appreciate the opportunity to care for you. Lupita CHARLENA Commander, MD, [facility]  Resume previous diet. Continue present medications.  Await pathology results. Repeat colonoscopy in 10 years for screening purposes.  YOU HAD AN ENDOSCOPIC PROCEDURE TODAY AT THE [facility]:   Refer to the procedure report that was given to you for any specific questions about what was found during the examination.  If the procedure report does not answer your questions, please call your gastroenterologist to clarify.  If you requested that your care partner not be given the details of your procedure findings, then the procedure report has been included in a sealed envelope for you to review at your convenience later.  YOU SHOULD EXPECT: Some feelings of bloating in the abdomen. Passage of more gas than usual.  Walking can help get rid of the air that was put into your GI tract during the procedure and reduce the bloating. If you had a lower endoscopy (such as a colonoscopy or flexible sigmoidoscopy) you may notice spotting of blood in your stool or on the toilet paper. If you underwent a bowel prep for your procedure, you may not have a normal bowel movement for a few days.  Please Note:  You might notice some irritation and congestion in your nose or some drainage.  This is from the oxygen used during your procedure.  There is no need for concern and it should clear up in a day or so.  SYMPTOMS TO REPORT IMMEDIATELY:  Following lower endoscopy (colonoscopy or flexible sigmoidoscopy):  Excessive amounts of blood in the stool  Significant tenderness or  worsening of abdominal pains  Swelling of the abdomen that is new, acute  Fever of 100F or higher  Following upper endoscopy (EGD)  Vomiting of blood or coffee ground material  New chest pain or pain under the shoulder blades  Painful or persistently difficult swallowing  New shortness of breath  Fever of 100F or higher  Black, tarry-looking stools  For urgent or emergent issues, a gastroenterologist can be reached at any hour by calling [phone]. Do not use MyChart messaging for urgent concerns.    DIET:  We do recommend a small meal at first, but then you may proceed to your regular diet.  Drink plenty of fluids but you should avoid alcoholic beverages for 24 hours.  ACTIVITY:  You should plan to take it easy for the rest of today and you should NOT DRIVE or use heavy machinery until tomorrow (because of the sedation medicines used during the test).    FOLLOW UP: Our staff will call the number listed on your records the next business day following your procedure.  We will call around 7:15- 8:00 am to check on you and address any questions or concerns that you may have regarding the information given to you following your procedure. If we do not reach you, we will leave a message.     If any biopsies were taken you will be contacted by phone or by letter within the next 1-3 weeks.  Please call us  at [phone] if you  have not heard about the biopsies in 3 weeks.    SIGNATURES/CONFIDENTIALITY: You and/or your care partner have signed paperwork which will be entered into your electronic medical record.  These signatures attest to the fact that that the information above on your After Visit Summary has been reviewed and is understood.  Full responsibility of the confidentiality of this discharge information lies with you and/or your care-partner.

## 2024-06-30 NOTE — Progress Notes (Signed)
 Pt's states no medical or surgical changes since previsit or office visit.

## 2024-06-30 NOTE — Progress Notes (Signed)
 Report given to PACU, vss

## 2024-06-30 NOTE — Op Note (Signed)
 [facility] Patient Name: Ashlee Perry Procedure Date: 06/30/2024 2:04 PM MRN: [id number] Endoscopist: Lupita FORBES Commander , MD, [id number] Age: 38 Referring MD:  Date of Birth: Feb 15, 1986 Gender: Female Account #: [id number] Procedure:                Colonoscopy Indications:              Iron  deficiency anemia Medicines:                Monitored Anesthesia Care Procedure:                Pre-Anesthesia Assessment:                           - Prior to the procedure, a History and Physical                            was performed, and patient medications and                            allergies were reviewed. The patient's tolerance of                            previous anesthesia was also reviewed. The risks                            and benefits of the procedure and the sedation                            options and risks were discussed with the patient.                            All questions were answered, and informed consent                            was obtained. Prior Anticoagulants: The patient has                            taken no anticoagulant or antiplatelet agents. ASA                            Grade Assessment: II - A patient with mild systemic                            disease. After reviewing the risks and benefits,                            the patient was deemed in satisfactory condition to                            undergo the procedure.                           After obtaining informed consent, the colonoscope  was passed under direct vision. Throughout the                            procedure, the patient's blood pressure, pulse, and                            oxygen saturations were monitored continuously. The                            [id number]-HQ190L Colonoscope [id number] was introduced                            through the anus and advanced to the the cecum,                            identified by appendiceal orifice and  ileocecal                            valve. The colonoscopy was performed without                            difficulty. The patient tolerated the procedure                            well. The quality of the bowel preparation was                            good. The ileocecal valve, appendiceal orifice, and                            rectum were photographed. The bowel preparation                            used was SUPREP via split dose instruction. Scope In: 2:42:04 PM Scope Out: 2:52:15 PM Scope Withdrawal Time: 0 hours 7 minutes 15 seconds  Total Procedure Duration: 0 hours 10 minutes 11 seconds  Findings:                 The perianal and digital rectal examinations were                            normal.                           The entire examined colon appeared normal on direct                            and retroflexion views. Complications:            No immediate complications. Estimated Blood Loss:     Estimated blood loss: none. Impression:               - The entire examined colon is normal on direct and  retroflexion views.                           - No specimens collected. Recommendation:           - Patient has a contact number available for                            emergencies. The signs and symptoms of potential                            delayed complications were discussed with the                            patient. Return to normal activities tomorrow.                            Written discharge instructions were provided to the                            patient.                           - Resume previous diet.                           - Continue present medications.                           - Repeat colonoscopy in 10 years for screening                            purposes. Lupita FORBES Commander, MD 06/30/2024 3:03:16 PM This report has been signed electronically.

## 2024-07-01 ENCOUNTER — Inpatient Hospital Stay: Admitting: Physician Assistant

## 2024-07-01 ENCOUNTER — Telehealth: Payer: Self-pay

## 2024-07-01 NOTE — Telephone Encounter (Signed)
 Attempted to reach patient for follow up phone call. No answer, left voicemail to contact Dr. Jadene Maxwell office with any questions or concerns.

## 2024-07-05 LAB — SURGICAL PATHOLOGY

## 2024-07-11 ENCOUNTER — Ambulatory Visit: Payer: Self-pay | Admitting: Internal Medicine

## 2024-09-30 ENCOUNTER — Telehealth: Payer: Self-pay

## 2024-09-30 ENCOUNTER — Other Ambulatory Visit: Payer: Self-pay | Admitting: Family Medicine

## 2024-09-30 DIAGNOSIS — Z1231 Encounter for screening mammogram for malignant neoplasm of breast: Secondary | ICD-10-CM

## 2024-09-30 NOTE — Telephone Encounter (Signed)
 Copied from CRM [id number]. Topic: Clinical - Request for Lab/Test Order >> Sep 30, 2024  4:28 PM Delon DASEN wrote: Reason for CRM: Patient wants to schedule labs before physical [phone]

## 2024-10-06 ENCOUNTER — Other Ambulatory Visit

## 2024-10-06 NOTE — Telephone Encounter (Signed)
 Pt labs is schedule already

## 2024-12-08 ENCOUNTER — Other Ambulatory Visit

## 2024-12-12 ENCOUNTER — Encounter: Admitting: Family Medicine

## 2024-12-22 ENCOUNTER — Ambulatory Visit
